# Patient Record
Sex: Female | Born: 1975 | Race: White | Hispanic: No | Marital: Married | Smoking: Former smoker
Health system: Southern US, Community
[De-identification: ages and names within clinical notes are randomized; demographics above are authoritative.]

## PROBLEM LIST (undated history)

## (undated) ENCOUNTER — Inpatient Hospital Stay (HOSPITAL_COMMUNITY): Payer: Self-pay

## (undated) DIAGNOSIS — J029 Acute pharyngitis, unspecified: Secondary | ICD-10-CM

## (undated) DIAGNOSIS — Z789 Other specified health status: Secondary | ICD-10-CM

## (undated) DIAGNOSIS — F988 Other specified behavioral and emotional disorders with onset usually occurring in childhood and adolescence: Secondary | ICD-10-CM

## (undated) DIAGNOSIS — N390 Urinary tract infection, site not specified: Secondary | ICD-10-CM

## (undated) DIAGNOSIS — Z8619 Personal history of other infectious and parasitic diseases: Secondary | ICD-10-CM

## (undated) DIAGNOSIS — H65199 Other acute nonsuppurative otitis media, unspecified ear: Secondary | ICD-10-CM

## (undated) DIAGNOSIS — O09529 Supervision of elderly multigravida, unspecified trimester: Secondary | ICD-10-CM

## (undated) DIAGNOSIS — B49 Unspecified mycosis: Secondary | ICD-10-CM

## (undated) DIAGNOSIS — E039 Hypothyroidism, unspecified: Secondary | ICD-10-CM

## (undated) DIAGNOSIS — J019 Acute sinusitis, unspecified: Secondary | ICD-10-CM

## (undated) HISTORY — PX: DILATION AND CURETTAGE OF UTERUS: SHX78

## (undated) HISTORY — DX: Acute pharyngitis, unspecified: J02.9

## (undated) HISTORY — DX: Unspecified mycosis: B49

## (undated) HISTORY — DX: Supervision of elderly multigravida, unspecified trimester: O09.529

## (undated) HISTORY — DX: Other specified behavioral and emotional disorders with onset usually occurring in childhood and adolescence: F98.8

## (undated) HISTORY — DX: Other acute nonsuppurative otitis media, unspecified ear: H65.199

## (undated) HISTORY — DX: Personal history of other infectious and parasitic diseases: Z86.19

## (undated) HISTORY — DX: Acute sinusitis, unspecified: J01.90

## (undated) HISTORY — DX: Hypothyroidism, unspecified: E03.9

## (undated) HISTORY — DX: Urinary tract infection, site not specified: N39.0

## (undated) HISTORY — PX: KNEE ARTHROSCOPY: SUR90

---

## 1998-04-16 ENCOUNTER — Emergency Department (HOSPITAL_COMMUNITY): Admission: EM | Admit: 1998-04-16 | Discharge: 1998-04-16 | Payer: Self-pay

## 2002-05-16 ENCOUNTER — Emergency Department (HOSPITAL_COMMUNITY): Admission: EM | Admit: 2002-05-16 | Discharge: 2002-05-17 | Payer: Self-pay | Admitting: Emergency Medicine

## 2002-05-17 ENCOUNTER — Encounter: Payer: Self-pay | Admitting: Emergency Medicine

## 2002-09-13 ENCOUNTER — Emergency Department (HOSPITAL_COMMUNITY): Admission: EM | Admit: 2002-09-13 | Discharge: 2002-09-13 | Payer: Self-pay | Admitting: Emergency Medicine

## 2002-09-14 ENCOUNTER — Encounter: Payer: Self-pay | Admitting: Surgery

## 2003-06-25 ENCOUNTER — Emergency Department (HOSPITAL_COMMUNITY): Admission: AD | Admit: 2003-06-25 | Discharge: 2003-06-25 | Payer: Self-pay | Admitting: Family Medicine

## 2005-08-06 ENCOUNTER — Other Ambulatory Visit: Admission: RE | Admit: 2005-08-06 | Discharge: 2005-08-06 | Payer: Self-pay | Admitting: Gynecology

## 2008-10-01 ENCOUNTER — Encounter: Payer: Self-pay | Admitting: Women's Health

## 2008-10-01 ENCOUNTER — Ambulatory Visit: Payer: Self-pay | Admitting: Women's Health

## 2008-10-01 ENCOUNTER — Other Ambulatory Visit: Admission: RE | Admit: 2008-10-01 | Discharge: 2008-10-01 | Payer: Self-pay | Admitting: Gynecology

## 2008-12-16 ENCOUNTER — Emergency Department: Payer: Self-pay | Admitting: Emergency Medicine

## 2009-01-28 ENCOUNTER — Ambulatory Visit: Payer: Self-pay | Admitting: Women's Health

## 2010-02-24 ENCOUNTER — Inpatient Hospital Stay (HOSPITAL_COMMUNITY): Admission: AD | Admit: 2010-02-24 | Discharge: 2010-02-24 | Payer: Self-pay | Admitting: Obstetrics and Gynecology

## 2010-02-24 ENCOUNTER — Ambulatory Visit: Payer: Self-pay | Admitting: Nurse Practitioner

## 2010-02-27 ENCOUNTER — Ambulatory Visit: Payer: Self-pay | Admitting: Women's Health

## 2010-03-09 ENCOUNTER — Ambulatory Visit: Payer: Self-pay | Admitting: Women's Health

## 2010-03-10 ENCOUNTER — Ambulatory Visit: Payer: Self-pay | Admitting: Gynecology

## 2010-03-10 ENCOUNTER — Ambulatory Visit (HOSPITAL_BASED_OUTPATIENT_CLINIC_OR_DEPARTMENT_OTHER): Admission: RE | Admit: 2010-03-10 | Discharge: 2010-03-10 | Payer: Self-pay | Admitting: Gynecology

## 2010-03-22 ENCOUNTER — Ambulatory Visit: Payer: Self-pay | Admitting: Gynecology

## 2010-09-24 ENCOUNTER — Inpatient Hospital Stay (HOSPITAL_COMMUNITY)
Admission: AD | Admit: 2010-09-24 | Discharge: 2010-09-24 | Payer: Self-pay | Source: Home / Self Care | Attending: Obstetrics and Gynecology | Admitting: Obstetrics and Gynecology

## 2010-09-25 LAB — URINALYSIS, ROUTINE W REFLEX MICROSCOPIC
Bilirubin Urine: NEGATIVE
Hgb urine dipstick: NEGATIVE
Ketones, ur: NEGATIVE mg/dL
Nitrite: NEGATIVE
Protein, ur: NEGATIVE mg/dL
Specific Gravity, Urine: 1.01 (ref 1.005–1.030)
Urine Glucose, Fasting: NEGATIVE mg/dL
Urobilinogen, UA: 0.2 mg/dL (ref 0.0–1.0)
pH: 6.5 (ref 5.0–8.0)

## 2010-11-13 ENCOUNTER — Inpatient Hospital Stay (HOSPITAL_COMMUNITY)
Admission: AD | Admit: 2010-11-13 | Discharge: 2010-11-13 | Disposition: A | Discharge status: Home / Self Care | Payer: BC Managed Care – PPO | Source: Ambulatory Visit | Attending: Obstetrics & Gynecology | Admitting: Obstetrics & Gynecology

## 2010-11-13 DIAGNOSIS — O4700 False labor before 37 completed weeks of gestation, unspecified trimester: Secondary | ICD-10-CM | POA: Insufficient documentation

## 2010-11-13 DIAGNOSIS — Y92009 Unspecified place in unspecified non-institutional (private) residence as the place of occurrence of the external cause: Secondary | ICD-10-CM | POA: Insufficient documentation

## 2010-11-13 DIAGNOSIS — W108XXA Fall (on) (from) other stairs and steps, initial encounter: Secondary | ICD-10-CM | POA: Insufficient documentation

## 2010-11-18 ENCOUNTER — Inpatient Hospital Stay (HOSPITAL_COMMUNITY)
Admission: AD | Admit: 2010-11-18 | Discharge: 2010-11-18 | Disposition: A | Discharge status: Home / Self Care | Payer: BC Managed Care – PPO | Source: Ambulatory Visit | Attending: Obstetrics & Gynecology | Admitting: Obstetrics & Gynecology

## 2010-11-18 DIAGNOSIS — O99891 Other specified diseases and conditions complicating pregnancy: Secondary | ICD-10-CM | POA: Insufficient documentation

## 2010-11-18 DIAGNOSIS — R109 Unspecified abdominal pain: Secondary | ICD-10-CM | POA: Insufficient documentation

## 2010-11-26 LAB — DIFFERENTIAL
Basophils Absolute: 0.1 10*3/uL (ref 0.0–0.1)
Basophils Relative: 0 % (ref 0–1)
Eosinophils Absolute: 0.3 10*3/uL (ref 0.0–0.7)
Eosinophils Relative: 1 % (ref 0–5)
Lymphocytes Relative: 9 % — ABNORMAL LOW (ref 12–46)
Lymphs Abs: 1.6 10*3/uL (ref 0.7–4.0)
Monocytes Absolute: 1 10*3/uL (ref 0.1–1.0)
Monocytes Relative: 6 % (ref 3–12)
Neutro Abs: 15.3 10*3/uL — ABNORMAL HIGH (ref 1.7–7.7)
Neutrophils Relative %: 84 % — ABNORMAL HIGH (ref 43–77)

## 2010-11-26 LAB — URINALYSIS, ROUTINE W REFLEX MICROSCOPIC
Bilirubin Urine: NEGATIVE
Glucose, UA: 100 mg/dL — AB
Ketones, ur: NEGATIVE mg/dL
Nitrite: POSITIVE — AB
Protein, ur: 100 mg/dL — AB
Specific Gravity, Urine: >1.03 — ABNORMAL HIGH (ref 1.005–1.030)
Urobilinogen, UA: 0.2 mg/dL (ref 0.0–1.0)
pH: 6 (ref 5.0–8.0)

## 2010-11-26 LAB — CBC
HCT: 36.9 % (ref 36.0–46.0)
Hemoglobin: 12.6 g/dL (ref 12.0–15.0)
MCHC: 34.3 g/dL (ref 30.0–36.0)
MCV: 95.1 fL (ref 78.0–100.0)
Platelets: 216 10*3/uL (ref 150–400)
RBC: 3.88 MIL/uL (ref 3.87–5.11)
RDW: 12.9 % (ref 11.5–15.5)
WBC: 18.3 10*3/uL — ABNORMAL HIGH (ref 4.0–10.5)

## 2010-11-26 LAB — URINE CULTURE: Colony Count: >=100000

## 2010-11-26 LAB — POCT PREGNANCY, URINE: Preg Test, Ur: POSITIVE

## 2010-11-26 LAB — HCG, QUANTITATIVE, PREGNANCY: hCG, Beta Chain, Quant, S: 86100 m[IU]/mL — ABNORMAL HIGH (ref <5)

## 2010-11-26 LAB — WET PREP, GENITAL
Clue Cells Wet Prep HPF POC: NONE SEEN
Trich, Wet Prep: NONE SEEN
Yeast Wet Prep HPF POC: NONE SEEN

## 2010-11-26 LAB — GC/CHLAMYDIA PROBE AMP, GENITAL
Chlamydia, DNA Probe: NEGATIVE
GC Probe Amp, Genital: NEGATIVE

## 2010-11-26 LAB — URINE MICROSCOPIC-ADD ON

## 2011-01-16 ENCOUNTER — Inpatient Hospital Stay (HOSPITAL_COMMUNITY)
Admission: AD | Admit: 2011-01-16 | Discharge: 2011-01-16 | Disposition: A | Discharge status: Home / Self Care | Payer: BC Managed Care – PPO | Source: Ambulatory Visit | Attending: Obstetrics and Gynecology | Admitting: Obstetrics and Gynecology

## 2011-01-16 DIAGNOSIS — O368190 Decreased fetal movements, unspecified trimester, not applicable or unspecified: Secondary | ICD-10-CM | POA: Insufficient documentation

## 2011-01-19 NOTE — Consult Note (Signed)
  NAME:  Cynthia Baxter              ACCOUNT NO.:  [id number]  MEDICAL RECORD NO.:  [id number]           PATIENT TYPE:  O  LOCATION:  [facility]                         [facility]  PHYSICIAN:  [name], M.D.DATE OF BIRTH:  05-07-1976  DATE OF CONSULTATION:  01/16/2011 DATE OF DISCHARGE:  01/16/2011                                CONSULTATION   CHIEF COMPLAINT:  Decreased fetal movement.  HISTORY OF PRESENT ILLNESS:  This is a 35 year old white female G2, P0 at 71 weeks' gestation who presents with decreased fetal movement today. She denies bleeding or leakage of fluid.  She denies regular contractions.  ALLERGIES:  She is allergic to SULFA.  MEDICATIONS:  Prenatal vitamins.  PAST MEDICAL HISTORY:  Otherwise, noncontributory.  SOCIAL HISTORY:  Noncontributory.  FAMILY HISTORY:  Anxiety, MI, COPD, stroke, heart disease, hypertension. She has a history of previous missed AB with D and C.  PHYSICAL EXAMINATION:  GENERAL:  Well-developed, well-nourished white female, in no acute distress. HEENT:  Normal. LUNGS:  Clear. HEART:  Regular rate and rhythm. ABDOMEN:  Soft, gravid, nontender.  Cervical __________. EXTREMITIES:  There are no cords. NEUROLOGIC:  Nonfocal. SKIN:  Intact.  NST is reactive.  The fetal heart rate tracing is in the 140-150 beats per minute range.  There are multiple 15 x 15 beat accelerations  noted. Rare contractions.  Category I tracing noted.  IMPRESSION:  Decreased fetal movement with reactive NST.  PLAN:  Discharge home.  Labor warnings given.  Reassurance given.     [name], [name]     RJT/MEDQ  D:  01/16/2011  T:  01/17/2011  Job:  [id number]  Electronically Signed by Olivia Mackie [name] on 01/19/2011 11:03:47 AM

## 2011-01-23 ENCOUNTER — Inpatient Hospital Stay (HOSPITAL_COMMUNITY)
Admission: AD | Admit: 2011-01-23 | Discharge: 2011-01-24 | Disposition: A | Discharge status: Home / Self Care | Payer: BC Managed Care – PPO | Source: Ambulatory Visit | Attending: Obstetrics and Gynecology | Admitting: Obstetrics and Gynecology

## 2011-01-23 DIAGNOSIS — O479 False labor, unspecified: Secondary | ICD-10-CM | POA: Insufficient documentation

## 2011-01-28 ENCOUNTER — Inpatient Hospital Stay (HOSPITAL_COMMUNITY)
Admission: AD | Admit: 2011-01-28 | Discharge: 2011-02-01 | DRG: 371 | Disposition: A | Discharge status: Home / Self Care | Payer: BC Managed Care – PPO | Source: Ambulatory Visit | Attending: Obstetrics and Gynecology | Admitting: Obstetrics and Gynecology

## 2011-01-28 DIAGNOSIS — O09529 Supervision of elderly multigravida, unspecified trimester: Secondary | ICD-10-CM | POA: Diagnosis present

## 2011-01-28 DIAGNOSIS — O321XX0 Maternal care for breech presentation, not applicable or unspecified: Principal | ICD-10-CM | POA: Diagnosis present

## 2011-01-28 LAB — CBC
HCT: 42.4 % (ref 36.0–46.0)
Hemoglobin: 14.5 g/dL (ref 12.0–15.0)
MCH: 31.7 pg (ref 26.0–34.0)
MCHC: 34.2 g/dL (ref 30.0–36.0)
MCV: 92.6 fL (ref 78.0–100.0)
Platelets: 196 10*3/uL (ref 150–400)
RBC: 4.58 MIL/uL (ref 3.87–5.11)
RDW: 13.1 % (ref 11.5–15.5)
WBC: 14.6 10*3/uL — ABNORMAL HIGH (ref 4.0–10.5)

## 2011-01-29 LAB — RPR: RPR Ser Ql: NONREACTIVE

## 2011-01-30 LAB — CBC
HCT: 30.7 % — ABNORMAL LOW (ref 36.0–46.0)
Hemoglobin: 10.1 g/dL — ABNORMAL LOW (ref 12.0–15.0)
MCH: 31.2 pg (ref 26.0–34.0)
MCHC: 32.9 g/dL (ref 30.0–36.0)
MCV: 94.8 fL (ref 78.0–100.0)
Platelets: 136 10*3/uL — ABNORMAL LOW (ref 150–400)
RBC: 3.24 MIL/uL — ABNORMAL LOW (ref 3.87–5.11)
RDW: 13.5 % (ref 11.5–15.5)
WBC: 10.6 10*3/uL — ABNORMAL HIGH (ref 4.0–10.5)

## 2011-02-09 NOTE — H&P (Signed)
  NAMEDortha Baxter              ACCOUNT NO.:  [id number]  MEDICAL RECORD NO.:  [id number]           PATIENT TYPE:  I  LOCATION:  9127                          [facility]:  [facility]  PHYSICIAN:  [name], M.D.DATE OF BIRTH:  08-02-1976  DATE OF ADMISSION:  01/28/2011 DATE OF DISCHARGE:                             HISTORY & PHYSICAL   PREOPERATIVE DIAGNOSIS:  Breech at 39 weeks and active labor.  POSTOPERATIVE DIAGNOSIS:  Breech at 39 weeks and active labor.  PROCEDURE:  Primary low segment transverse cesarean section.  SURGEON:  [name], MD  ASSISTANT:  None.  ANESTHESIA:  Epidural.  ESTIMATED BLOOD LOSS:  1000 mL.  COMPLICATIONS:  None.  DRAINS:  Foley.  COUNTS:  Correct.  The patient went to recovery in good condition.  FINDINGS:  Full-term living female, frank breech.  Apgars 9 and 9. Placenta anteriorly located.  Normal tubes, normal ovaries.  Three small subserosal fibroids on the posterior wall of the uterus.  Two-layer uterine closure.  BRIEF OPERATIVE NOTE:  After being apprised of the risks of anesthesia, infection, bleeding, injury to abdominal organs, need for repair, delayed versus immediate complications to include bowel and bladder injury, possible need for repair, the patient was brought to the operating room where she was administered dosing of epidural anesthetic without complications, prepped and draped in usual sterile fashion. Foley catheter placed.  After achieving adequate anesthesia, dilute Marcaine solution was placed.  A Pfannenstiel skin incision was made with a scalpel, carried down to the fascia which was nicked in midline and opened transversely using Mayo scissors.  Rectus muscle was dissected sharply in the midline.  Peritoneum was entered sharply, a bladder blade was placed.  The visceral peritoneum was scored sharply off the lower uterine segment.  Kerr hysterotomy incision was made. Atraumatic delivery from a frank  breech position using usual maneuvers with flexion of the fetal vertex prior to delivery performed without complication.  Placenta was delivered manually intact.  Cord was clamped, baby was handed off to the pediatricians who were in attendance.  Apgars as noted.  Cord blood collected as noted.  Uterus exteriorized, curette using a dry lap pack and inspected.  No evidence of uterine anomalies noted.  Uterus was closed in 2 running imbricating layers of 0 Monocryl suture.  Good hemostasis was noted.  Irrigation was accomplished. Bladder flap was inspected and found to be hemostatic.  Urine output was excellent, urine was clear.  At this time, fascia was reapproximated using a 0 Monocryl is continuous running fashion and skin closed using staples.  The patient tolerated the procedure well and was transferred to recovery in good condition.     [name], [name]     RJT/MEDQ  D:  01/29/2011  T:  01/29/2011  Job:  [id number]  Electronically Signed by Olivia Mackie [name] on 02/09/2011 02:02:03 PM

## 2011-02-09 NOTE — H&P (Signed)
  NAMEDortha Baxter              ACCOUNT NO.:  [id number]  MEDICAL RECORD NO.:  [id number]           PATIENT TYPE:  I  LOCATION:  9161                          [facility]ILITY[facility]  [facility]  PHYSICIAN:  [name], M.D.DATE OF BIRTH:  11/15/1975  DATE OF ADMISSION:  01/28/2011 DATE OF DISCHARGE:                             HISTORY & PHYSICAL   CHIEF COMPLAINT:  Contractions.  HISTORY OF PRESENT ILLNESS:  She is a 35 year old female G2, P0 at 42- 4/7 weeks' gestation who presents with increased frequency of contractions today.  She has allergies to SULFA.  She is a nonsmoker, nondrinker.  She denies domestic or physical violence.  Medications include prenatal vitamins. She has a family history of anxiety, heart disease, hypertension, stroke, and COPD.  She has a history of missed AB, D&C, and arthroscopic surgery.  She also has a history of intrinsic anxiety.  Currently on no medication.  Prenatal course to date has been uncomplicated.  PHYSICAL EXAMINATION:  GENERAL:  She is a well-developed, well-nourished white female in moderate amount of distress with her contractions. HEENT:  Normal. NECK:  Supple.  Full range of motion. LUNGS:  Clear. HEART:  Regular rate and rhythm. ABDOMEN:  Soft, gravid, nontender.  Estimated fetal weight __________ pounds.  Cervix is 2-3, 90% vertex, -1. EXTREMITIES:  No cords. NEUROLOGIC:  Nonfocal. SKIN:  Intact.  CLINICAL IMPRESSION:  Term intrauterine pregnancy and early labor.  PLAN:  Epidural as needed.     [name], [name]     RJT/MEDQ  D:  01/28/2011  T:  01/29/2011  Job:  [id number]  Electronically Signed by Olivia Mackie [name] on 02/09/2011 02:02:01 PM

## 2011-02-09 NOTE — Discharge Summary (Signed)
  NAMEDortha Baxter              ACCOUNT NO.:  [id number]  MEDICAL RECORD NO.:  [id number]           PATIENT TYPE:  I  LOCATION:  9127                          [facility]  PHYSICIAN:  [name], M.D.DATE OF BIRTH:  August 19, 1976  DATE OF ADMISSION:  01/28/2011 DATE OF DISCHARGE:  02/01/2011                              DISCHARGE SUMMARY   ADMISSION DIAGNOSES:  Term pregnancy, breech presentation in labor.  DISCHARGE DIAGNOSIS:  Postoperative day #3, stable.  HISTORY:  The patient is a gravida 2, para 0-0-1-0 at 31 weeks and 3 days' gestation with an [facility] of Feb 01, 2011.  Prenatal care was obtained at [facility] since 8 weeks and 3 days' gestation with Dr. Juliene Pina as primary.  PRENATAL LABORATORY DATA:  O positive, rubella immune, GBS negative, HIV negative, RPR nonreactive, hepatitis B nonreactive, and a normal 3-hour GTT.  Prenatal course complicated by breech presentation and advanced maternal age.  MEDICAL SURGICAL HISTORY:  Knee surgery and D and C.  ALLERGIES:  SULFA.  CURRENT MEDICATIONS:  Prenatal vitamins.  PRESENTATION:  The patient presented to the hospital in labor and was found to be breech presentation.  ADMISSION LABORATORY DATA:  CBC:  WBC 14.6, hemoglobin 14.5, hematocrit 42.4, and platelets 196.  RPR negative.  The patient was prepped for cesarean section.    SURGERY: The patient was delivered via cesarean section on Jan 29, 2011,  by Dr. Billy Coast for breech presentation in labor.  The patient delivered  a female infant with a weight of 6 pounds and 7 ounces and Apgars of 9  and 9.  Newborn was transferred to Regular Nursery. Please see operative  record for further detalis.  POSTOPERATIVE COURSE:  Postoperative course was uneventful.  POSTOPERATIVE LABORATORY DATA:  WBC 10.6, hemoglobin 10.1, hematocrit 30.7, and platelets 136.  Vital signs remained stable throughout hospital stay and the patient was afebrile.  Physical exam was within normal  limits.  Wound was approximated with staples.  No erythema and no ecchymosis at site.  Newborn was breastfed.  DISCHARGE:  The patient was discharged home on postoperative day #3 in stable condition.  Staples were removed and replaced with Steri-Strips prior to discharge.  DIET:  Regular.  ACTIVITY:  Ad lib with postoperative weightlifting restrictions x2 weeks.  INSTRUCTIONS:  Per [facility] booklet.  DISCHARGE MEDICATIONS: 1. Prenatal vitamins 1 tablet p.o. daily. 2. Ibuprofen 800 mg p.o. q.8 h. 3. Tylox 1-2 tablets p.o. q.4-6 h. p.r.n. for pain.  FOLLOWUP:  In 6 weeks postpartum for postpartum visit.    ______________________________ Arlan Organ, CNM   ______________________________ [name], [name]    DP/MEDQ  D:  02/02/2011  T:  02/03/2011  Job:  [id number]  Electronically Signed by Arlan Organ CNM on 02/05/2011 12:20:47 AM Electronically Signed by Olivia Mackie [name] on 02/09/2011 02:02:05 PM

## 2011-08-24 ENCOUNTER — Other Ambulatory Visit: Payer: Self-pay | Admitting: Obstetrics & Gynecology

## 2011-08-24 LAB — OB RESULTS CONSOLE RPR: RPR: NONREACTIVE

## 2011-08-24 LAB — OB RESULTS CONSOLE HIV ANTIBODY (ROUTINE TESTING): HIV: NONREACTIVE

## 2011-08-24 LAB — OB RESULTS CONSOLE ABO/RH: RH Type: POSITIVE

## 2011-08-24 LAB — OB RESULTS CONSOLE GC/CHLAMYDIA: Gonorrhea: NEGATIVE

## 2011-09-11 NOTE — L&D Delivery Note (Addendum)
Delivery Note At 5:41 PM a viable and healthy female was delivered via VBAC, Vacuum Assisted (Presentation: ; Right Occiput Anterior).  APGAR:9,9; weight 5 lbs 15 oz .   Placenta status: Spontaneous and intact.  Uterus explored and found without defects.  Cervix was without lacerations.  Cord: 3 vessels.  Anesthesia: Epidural  Episiotomy: None Lacerations: 2nd degree Suture Repair: 3.0 chromic Est. Blood Loss (mL): 400  Mom to postpartum.  Baby to nursery-stable.  Mickel Baas 03/19/2012, 6:22 PM

## 2012-03-03 LAB — OB RESULTS CONSOLE GBS: GBS: NEGATIVE

## 2012-03-06 ENCOUNTER — Inpatient Hospital Stay (HOSPITAL_COMMUNITY)
Admission: AD | Admit: 2012-03-06 | Discharge: 2012-03-06 | Disposition: A | Discharge status: Home / Self Care | Payer: BC Managed Care – PPO | Source: Ambulatory Visit | Attending: Obstetrics and Gynecology | Admitting: Obstetrics and Gynecology

## 2012-03-06 ENCOUNTER — Encounter (HOSPITAL_COMMUNITY): Payer: Self-pay | Admitting: *Deleted

## 2012-03-06 DIAGNOSIS — O4700 False labor before 37 completed weeks of gestation, unspecified trimester: Secondary | ICD-10-CM | POA: Insufficient documentation

## 2012-03-06 DIAGNOSIS — O99891 Other specified diseases and conditions complicating pregnancy: Secondary | ICD-10-CM | POA: Insufficient documentation

## 2012-03-06 DIAGNOSIS — O212 Late vomiting of pregnancy: Secondary | ICD-10-CM | POA: Insufficient documentation

## 2012-03-06 DIAGNOSIS — K5289 Other specified noninfective gastroenteritis and colitis: Secondary | ICD-10-CM | POA: Insufficient documentation

## 2012-03-06 DIAGNOSIS — K529 Noninfective gastroenteritis and colitis, unspecified: Secondary | ICD-10-CM

## 2012-03-06 HISTORY — DX: Other specified health status: Z78.9

## 2012-03-06 LAB — URINALYSIS, ROUTINE W REFLEX MICROSCOPIC
Bilirubin Urine: NEGATIVE
Glucose, UA: NEGATIVE mg/dL
Ketones, ur: NEGATIVE mg/dL
Leukocytes, UA: NEGATIVE
Nitrite: NEGATIVE
Protein, ur: NEGATIVE mg/dL
Specific Gravity, Urine: 1.02 (ref 1.005–1.030)
Urobilinogen, UA: 0.2 mg/dL (ref 0.0–1.0)
pH: 6 (ref 5.0–8.0)

## 2012-03-06 LAB — URINE MICROSCOPIC-ADD ON

## 2012-03-06 MED ORDER — ONDANSETRON 8 MG PO TBDP: 8.0000 mg | ORAL_TABLET | Freq: Three times a day (TID) | ORAL | Status: AC | PRN

## 2012-03-06 MED ORDER — ONDANSETRON 8 MG PO TBDP
8.0000 mg | ORAL_TABLET | Freq: Once | ORAL | Status: AC
  Administered 2012-03-06: 8 mg via ORAL
  Filled 2012-03-06: qty 1

## 2012-03-06 MED ORDER — LOPERAMIDE HCL 2 MG PO TABS: 2.0000 mg | ORAL_TABLET | ORAL | Status: DC

## 2012-03-06 MED ORDER — LOPERAMIDE HCL 2 MG PO CAPS
4.0000 mg | ORAL_CAPSULE | Freq: Once | ORAL | Status: AC
  Administered 2012-03-06: 4 mg via ORAL
  Filled 2012-03-06: qty 2

## 2012-03-06 NOTE — [facility]U Note (Signed)
Pt G2 P1 at 36.2wks with contractions, nausea and diarrhea.

## 2012-03-06 NOTE — Discharge Instructions (Signed)
Diet for Diarrhea, Adult Having frequent, runny stools (diarrhea) has many causes. Diarrhea may be caused or worsened by food or drink. Diarrhea may be relieved by changing your diet. IF YOU ARE NOT TOLERATING SOLID FOODS:  Drink enough water and fluids to keep your urine clear or pale yellow.   Avoid sugary drinks and sodas as well as milk-based beverages.   Avoid beverages containing caffeine and alcohol.   You may try rehydrating beverages. You can make your own by following this recipe:    tsp table salt.    tsp baking soda.   ? tsp salt substitute (potassium chloride).   1 tbs + 1 tsp sugar.   1 qt water.  As your stools become more solid, you can start eating solid foods. Add foods one at a time. If a certain food causes your diarrhea to get worse, avoid that food and try other foods. A low fiber, low-fat, and lactose-free diet is recommended. Small, frequent meals may be better tolerated.  Starches  Allowed:  White, French, and pita breads, plain rolls, buns, bagels. Plain muffins, matzo. Soda, saltine, or graham crackers. Pretzels, melba toast, zwieback. Cooked cereals made with water: cornmeal, farina, cream cereals. Dry cereals: refined corn, wheat, rice. Potatoes prepared any way without skins, refined macaroni, spaghetti, noodles, refined rice.   Avoid:  Bread, rolls, or crackers made with whole wheat, multi-grains, rye, bran seeds, nuts, or coconut. Corn tortillas or taco shells. Cereals containing whole grains, multi-grains, bran, coconut, nuts, or raisins. Cooked or dry oatmeal. Coarse wheat cereals, granola. Cereals advertised as "high-fiber." Potato skins. Whole grain pasta, wild or brown rice. Popcorn. Sweet potatoes/yams. Sweet rolls, doughnuts, waffles, pancakes, sweet breads.  Vegetables  Allowed: Strained tomato and vegetable juices. Most well-cooked and canned vegetables without seeds. Fresh: Tender lettuce, cucumber without the skin, cabbage, spinach, bean  sprouts.   Avoid: Fresh, cooked, or canned: Artichokes, baked beans, beet greens, broccoli, Brussels sprouts, corn, kale, legumes, peas, sweet potatoes. Cooked: Green or red cabbage, spinach. Avoid large servings of any vegetables, because vegetables shrink when cooked, and they contain more fiber per serving than fresh vegetables.  Fruit  Allowed: All fruit juices except prune juice. Cooked or canned: Apricots, applesauce, cantaloupe, cherries, fruit cocktail, grapefruit, grapes, kiwi, mandarin oranges, peaches, pears, plums, watermelon. Fresh: Apples without skin, ripe banana, grapes, cantaloupe, cherries, grapefruit, peaches, oranges, plums. Keep servings limited to  cup or 1 piece.   Avoid: Fresh: Apple with skin, apricots, mango, pears, raspberries, strawberries. Prune juice, stewed or dried prunes. Dried fruits, raisins, dates. Large servings of all fresh fruits.  Meat and Meat Substitutes  Allowed: Ground or well-cooked tender beef, ham, veal, lamb, pork, or poultry. Eggs, plain cheese. Fish, oysters, shrimp, lobster, other seafoods. Liver, organ meats.   Avoid: Tough, fibrous meats with gristle. Peanut butter, smooth or chunky. Cheese, nuts, seeds, legumes, dried peas, beans, lentils.  Milk  Allowed: Yogurt, lactose-free milk, kefir, drinkable yogurt, buttermilk, soy milk.   Avoid: Milk, chocolate milk, beverages made with milk, such as milk shakes.  Soups  Allowed: Bouillon, broth, or soups made from allowed foods. Any strained soup.   Avoid: Soups made from vegetables that are not allowed, cream or milk-based soups.  Desserts and Sweets  Allowed: Sugar-free gelatin, sugar-free frozen ice pops made without sugar alcohol.   Avoid: Plain cakes and cookies, pie made with allowed fruit, pudding, custard, cream pie. Gelatin, fruit, ice, sherbet, frozen ice pops. Ice cream, ice milk without nuts. Plain hard candy,   honey, jelly, molasses, syrup, sugar, chocolate syrup, gumdrops,  marshmallows.  Fats and Oils  Allowed: Avoid any fats and oils.   Avoid: Seeds, nuts, olives, avocados. Margarine, butter, cream, mayonnaise, salad oils, plain salad dressings made from allowed foods. Plain gravy, crisp bacon without rind.  Beverages  Allowed: Water, decaffeinated teas, oral rehydration solutions, sugar-free beverages.   Avoid: Fruit juices, caffeinated beverages (coffee, tea, soda or pop), alcohol, sports drinks, or lemon-lime soda or pop.  Condiments  Allowed: Ketchup, mustard, horseradish, vinegar, cream sauce, cheese sauce, cocoa powder. Spices in moderation: allspice, basil, bay leaves, celery powder or leaves, cinnamon, cumin powder, curry powder, ginger, mace, marjoram, onion or garlic powder, oregano, paprika, parsley flakes, ground pepper, rosemary, sage, savory, tarragon, thyme, turmeric.   Avoid: Coconut, honey.  Weight Monitoring: Weigh yourself every day. You should weigh yourself in the morning after you urinate and before you eat breakfast. Wear the same amount of clothing when you weigh yourself. Record your weight daily. Bring your recorded weights to your clinic visits. Tell your caregiver right away if you have gained 3 lb/1.4 kg or more in 1 day, 5 lb/2.3 kg in a week, or whatever amount you were told to report. SEEK IMMEDIATE MEDICAL CARE IF:   You are unable to keep fluids down.   You start to throw up (vomit) or diarrhea keeps coming back (persistent).   Abdominal pain develops, increases, or can be felt in one place (localizes).   You have an oral temperature above 102 F (38.9 C), not controlled by medicine.   Diarrhea contains blood or mucus.   You develop excessive weakness, dizziness, fainting, or extreme thirst.  MAKE SURE YOU:   Understand these instructions.   Will watch your condition.   Will get help right away if you are not doing well or get worse.  Document Released: 11/17/2003 Document Revised: 08/16/2011 Document Reviewed:  03/10/2009 [facility] Patient Information 2012 [place], [place].Diet for Diarrhea, Adult Having frequent, runny stools (diarrhea) has many causes. Diarrhea may be caused or worsened by food or drink. Diarrhea may be relieved by changing your diet. IF YOU ARE NOT TOLERATING SOLID FOODS:  Drink enough water and fluids to keep your urine clear or pale yellow.   Avoid sugary drinks and sodas as well as milk-based beverages.   Avoid beverages containing caffeine and alcohol.   You may try rehydrating beverages. You can make your own by following this recipe:    tsp table salt.    tsp baking soda.   ? tsp salt substitute (potassium chloride).   1 tbs + 1 tsp sugar.   1 qt water.  As your stools become more solid, you can start eating solid foods. Add foods one at a time. If a certain food causes your diarrhea to get worse, avoid that food and try other foods. A low fiber, low-fat, and lactose-free diet is recommended. Small, frequent meals may be better tolerated.  Starches  Allowed:  White, [place], and pita breads, plain rolls, buns, bagels. Plain muffins, matzo. Soda, saltine, or graham crackers. Pretzels, melba toast, zwieback. Cooked cereals made with water: cornmeal, farina, cream cereals. Dry cereals: refined corn, wheat, rice. Potatoes prepared any way without skins, refined macaroni, spaghetti, noodles, refined rice.   Avoid:  Bread, rolls, or crackers made with whole wheat, multi-grains, rye, bran seeds, nuts, or coconut. Corn tortillas or taco shells. Cereals containing whole grains, multi-grains, bran, coconut, nuts, or raisins. Cooked or dry oatmeal. Coarse wheat cereals, granola. Cereals advertised  as "high-fiber." Potato skins. Whole grain pasta, wild or brown rice. Popcorn. Sweet potatoes/yams. Sweet rolls, doughnuts, waffles, pancakes, sweet breads.  Vegetables  Allowed: Strained tomato and vegetable juices. Most well-cooked and canned vegetables without seeds. Fresh: Tender  lettuce, cucumber without the skin, cabbage, spinach, bean sprouts.   Avoid: Fresh, cooked, or canned: Artichokes, baked beans, beet greens, broccoli, Brussels sprouts, corn, kale, legumes, peas, sweet potatoes. Cooked: Green or red cabbage, spinach. Avoid large servings of any vegetables, because vegetables shrink when cooked, and they contain more fiber per serving than fresh vegetables.  Fruit  Allowed: All fruit juices except prune juice. Cooked or canned: Apricots, applesauce, cantaloupe, cherries, fruit cocktail, grapefruit, grapes, kiwi, mandarin oranges, peaches, pears, plums, watermelon. Fresh: Apples without skin, ripe banana, grapes, cantaloupe, cherries, grapefruit, peaches, oranges, plums. Keep servings limited to  cup or 1 piece.   Avoid: Fresh: Apple with skin, apricots, mango, pears, raspberries, strawberries. Prune juice, stewed or dried prunes. Dried fruits, raisins, dates. Large servings of all fresh fruits.  Meat and Meat Substitutes  Allowed: Ground or well-cooked tender beef, ham, veal, lamb, pork, or poultry. Eggs, plain cheese. Fish, oysters, shrimp, lobster, other seafoods. Liver, organ meats.   Avoid: Tough, fibrous meats with gristle. Peanut butter, smooth or chunky. Cheese, nuts, seeds, legumes, dried peas, beans, lentils.  Milk  Allowed: Yogurt, lactose-free milk, kefir, drinkable yogurt, buttermilk, soy milk.   Avoid: Milk, chocolate milk, beverages made with milk, such as milk shakes.  Soups  Allowed: Bouillon, broth, or soups made from allowed foods. Any strained soup.   Avoid: Soups made from vegetables that are not allowed, cream or milk-based soups.  Desserts and Sweets  Allowed: Sugar-free gelatin, sugar-free frozen ice pops made without sugar alcohol.   Avoid: Plain cakes and cookies, pie made with allowed fruit, pudding, custard, cream pie. Gelatin, fruit, ice, sherbet, frozen ice pops. Ice cream, ice milk without nuts. Plain hard candy, honey, jelly,  molasses, syrup, sugar, chocolate syrup, gumdrops, marshmallows.  Fats and Oils  Allowed: Avoid any fats and oils.   Avoid: Seeds, nuts, olives, avocados. Margarine, butter, cream, mayonnaise, salad oils, plain salad dressings made from allowed foods. Plain gravy, crisp bacon without rind.  Beverages  Allowed: Water, decaffeinated teas, oral rehydration solutions, sugar-free beverages.   Avoid: Fruit juices, caffeinated beverages (coffee, tea, soda or pop), alcohol, sports drinks, or lemon-lime soda or pop.  Condiments  Allowed: Ketchup, mustard, horseradish, vinegar, cream sauce, cheese sauce, cocoa powder. Spices in moderation: allspice, basil, bay leaves, celery powder or leaves, cinnamon, cumin powder, curry powder, ginger, mace, marjoram, onion or garlic powder, oregano, paprika, parsley flakes, ground pepper, rosemary, sage, savory, tarragon, thyme, turmeric.   Avoid: Coconut, honey.  Weight Monitoring: Weigh yourself every day. You should weigh yourself in the morning after you urinate and before you eat breakfast. Wear the same amount of clothing when you weigh yourself. Record your weight daily. Bring your recorded weights to your clinic visits. Tell your caregiver right away if you have gained 3 lb/1.4 kg or more in 1 day, 5 lb/2.3 kg in a week, or whatever amount you were told to report. SEEK IMMEDIATE MEDICAL CARE IF:   You are unable to keep fluids down.   You start to throw up (vomit) or diarrhea keeps coming back (persistent).   Abdominal pain develops, increases, or can be felt in one place (localizes).   You have an oral temperature above 102 F (38.9 C), not controlled by medicine.   Diarrhea  contains blood or mucus.   You develop excessive weakness, dizziness, fainting, or extreme thirst.  MAKE SURE YOU:   Understand these instructions.   Will watch your condition.   Will get help right away if you are not doing well or get worse.  Document Released:  11/17/2003 Document Revised: 08/16/2011 Document Reviewed: 03/10/2009 [facility] [employer] Patient Information 2012 [place], [place].

## 2012-03-06 NOTE — MAU Provider Note (Signed)
Chief Complaint:  Contractions, Nausea and Diarrhea   None     HPI  Cynthia Baxter is a 36 y.o. G3P1011 at [redacted]w[redacted]d presenting with diarrhea x 1 day, vomiting since arrival to MAU and mild-moderate contractions. Denies leakage of fluid or vaginal bleeding. Good fetal movement. Reports sick contacts.   Past Medical History: Past Medical History  Diagnosis Date  . No pertinent past medical history     Past Surgical History: Past Surgical History  Procedure Date  . Cesarean section   . Knee arthroscopy     Family History: Family History  Problem Relation Age of Onset  . Other Neg Hx     Social History: History  Substance Use Topics  . Smoking status: Never Smoker   . Smokeless tobacco: Not on file  . Alcohol Use: No    Allergies:  Allergies  Allergen Reactions  . Sulfa Antibiotics Hives    Meds:  Prescriptions prior to admission  Medication Sig Dispense Refill  . ketoconazole (NIZORAL) 200 MG tablet Take 200 mg by mouth 2 (two) times daily.      . Prenatal Vit-Fe Fumarate-FA (MULTIVITAMIN-PRENATAL) 27-0.8 MG TABS Take 1 tablet by mouth daily.          Physical Exam  Blood pressure 112/71, pulse 103, temperature 97.7 F (36.5 C), temperature source Oral, resp. rate 18, height 5\' 2"  (1.575 m), weight 65.772 kg (145 lb). GENERAL: Well-developed, well-nourished female in mild distress.  HEENT: normocephalic, good dentition HEART: normal rate RESP: normal effort ABDOMEN: Soft, nontender, gravid appropriate for gestational age EXTREMITIES: Nontender, no edema NEURO: alert and oriented  SPECULUM EXAM: Deferred  Dilation: 1 Effacement (%): 70 Cervical Position: Middle Station: -2 Presentation: Undeterminable Exam by:: Peace, rn  FHT:  Baseline 130 , moderate variability, accelerations present, no decelerations Contractions: q 2-5 mins   Labs: Results for orders placed during the hospital encounter of 03/06/12 (from the past 24 hour(s))  URINALYSIS,  ROUTINE W REFLEX MICROSCOPIC     Status: Abnormal   Collection Time   03/06/12  1:31 AM      Component Value Range   Color, Urine YELLOW  YELLOW   APPearance CLEAR  CLEAR   Specific Gravity, Urine 1.020  1.005 - 1.030   pH 6.0  5.0 - 8.0   Glucose, UA NEGATIVE  NEGATIVE mg/dL   Hgb urine dipstick TRACE (*) NEGATIVE   Bilirubin Urine NEGATIVE  NEGATIVE   Ketones, ur NEGATIVE  NEGATIVE mg/dL   Protein, ur NEGATIVE  NEGATIVE mg/dL   Urobilinogen, UA 0.2  0.0 - 1.0 mg/dL   Nitrite NEGATIVE  NEGATIVE   Leukocytes, UA NEGATIVE  NEGATIVE  URINE MICROSCOPIC-ADD ON     Status: Abnormal   Collection Time   03/06/12  1:31 AM      Component Value Range   Squamous Epithelial / LPF FEW (*) RARE   WBC, UA 0-2  <3 WBC/hpf   RBC / HPF 0-2  <3 RBC/hpf   Bacteria, UA FEW (*) RARE   Imaging:  NA  N/V resolved w/ zofran. Imodium given. No further diarrhea. UC's decreased.   Assessment: 1. Gastroenteritis, acute    Plan: Discharge home per consult with Dr. Eula Fried diet Push fluids Preterm labor precautions and fetal kick counts Follow-up Information    Follow up with Almon Hercules., MD. (or MAU as needed if symptoms worsen)    Contact information:   [address] [place] [address] [phone]  Medication List  As of 03/07/2012  4:10 AM   START taking these medications         loperamide 2 MG tablet   Commonly known as: IMODIUM A-D   Take 1 tablet (2 mg total) by mouth as directed.      ondansetron 8 MG disintegrating tablet   Commonly known as: ZOFRAN-ODT   Take 1 tablet (8 mg total) by mouth every 8 (eight) hours as needed for nausea.         CONTINUE taking these medications         ketoconazole 200 MG tablet   Commonly known as: NIZORAL      multivitamin-prenatal 27-0.8 MG Tabs          Where to get your medications    These are the prescriptions that you need to pick up.   You may get these medications from any pharmacy.           loperamide 2 MG tablet   ondansetron 8 MG disintegrating tablet           [name] 03/06/2012 3:11 AM

## 2012-03-10 ENCOUNTER — Other Ambulatory Visit (HOSPITAL_COMMUNITY): Payer: Self-pay | Admitting: *Deleted

## 2012-03-11 ENCOUNTER — Telehealth (HOSPITAL_COMMUNITY): Payer: Self-pay | Admitting: *Deleted

## 2012-03-11 ENCOUNTER — Encounter (HOSPITAL_COMMUNITY): Payer: Self-pay | Admitting: *Deleted

## 2012-03-11 NOTE — Telephone Encounter (Signed)
Preadmission screen  

## 2012-03-14 ENCOUNTER — Inpatient Hospital Stay (HOSPITAL_COMMUNITY)
Admission: AD | Admit: 2012-03-14 | Discharge: 2012-03-14 | Disposition: A | Discharge status: Home / Self Care | Payer: BC Managed Care – PPO | Source: Ambulatory Visit | Attending: Obstetrics and Gynecology | Admitting: Obstetrics and Gynecology

## 2012-03-14 DIAGNOSIS — O365990 Maternal care for other known or suspected poor fetal growth, unspecified trimester, not applicable or unspecified: Secondary | ICD-10-CM | POA: Insufficient documentation

## 2012-03-19 ENCOUNTER — Inpatient Hospital Stay (HOSPITAL_COMMUNITY): Payer: BC Managed Care – PPO | Admitting: Anesthesiology

## 2012-03-19 ENCOUNTER — Encounter (HOSPITAL_COMMUNITY): Payer: Self-pay

## 2012-03-19 ENCOUNTER — Inpatient Hospital Stay (HOSPITAL_COMMUNITY)
Admission: RE | Admit: 2012-03-19 | Discharge: 2012-03-20 | DRG: 373 | Disposition: A | Discharge status: Home / Self Care | Payer: BC Managed Care – PPO | Source: Ambulatory Visit | Attending: Obstetrics & Gynecology | Admitting: Obstetrics & Gynecology

## 2012-03-19 ENCOUNTER — Encounter (HOSPITAL_COMMUNITY): Payer: Self-pay | Admitting: Anesthesiology

## 2012-03-19 VITALS — BP 94/60 | HR 76 | Temp 97.8°F | Resp 18 | Ht 62.0 in | Wt 149.0 lb

## 2012-03-19 DIAGNOSIS — O878 Other venous complications in the puerperium: Secondary | ICD-10-CM | POA: Diagnosis present

## 2012-03-19 DIAGNOSIS — O365990 Maternal care for other known or suspected poor fetal growth, unspecified trimester, not applicable or unspecified: Principal | ICD-10-CM | POA: Diagnosis present

## 2012-03-19 DIAGNOSIS — O34219 Maternal care for unspecified type scar from previous cesarean delivery: Secondary | ICD-10-CM | POA: Diagnosis present

## 2012-03-19 DIAGNOSIS — K649 Unspecified hemorrhoids: Secondary | ICD-10-CM | POA: Diagnosis present

## 2012-03-19 LAB — CBC
MCH: 30.3 pg (ref 26.0–34.0)
MCV: 90.8 fL (ref 78.0–100.0)
Platelets: 181 10*3/uL (ref 150–400)
RBC: 4.02 MIL/uL (ref 3.87–5.11)

## 2012-03-19 MED ORDER — FLEET ENEMA 7-19 GM/118ML RE ENEM: 1.0000 | ENEMA | RECTAL | Status: DC | PRN

## 2012-03-19 MED ORDER — OXYTOCIN 40 UNITS IN LACTATED RINGERS INFUSION - SIMPLE MED
1.0000 m[IU]/min | INTRAVENOUS | Status: DC
  Administered 2012-03-19: 2 m[IU]/min via INTRAVENOUS

## 2012-03-19 MED ORDER — WITCH HAZEL-GLYCERIN EX PADS: 1.0000 "application " | MEDICATED_PAD | CUTANEOUS | Status: DC | PRN

## 2012-03-19 MED ORDER — ACETAMINOPHEN 325 MG PO TABS: 650.0000 mg | ORAL_TABLET | ORAL | Status: DC | PRN

## 2012-03-19 MED ORDER — TETANUS-DIPHTH-ACELL PERTUSSIS 5-2.5-18.5 LF-MCG/0.5 IM SUSP
0.5000 mL | Freq: Once | INTRAMUSCULAR | Status: AC
  Administered 2012-03-20: 0.5 mL via INTRAMUSCULAR
  Filled 2012-03-19: qty 0.5

## 2012-03-19 MED ORDER — FENTANYL 2.5 MCG/ML BUPIVACAINE 1/10 % EPIDURAL INFUSION (WH - ANES)
14.0000 mL/h | INTRAMUSCULAR | Status: DC
  Administered 2012-03-19: 14 mL/h via EPIDURAL
  Filled 2012-03-19 (×2): qty 60

## 2012-03-19 MED ORDER — ONDANSETRON HCL 4 MG PO TABS: 4.0000 mg | ORAL_TABLET | ORAL | Status: DC | PRN

## 2012-03-19 MED ORDER — PHENYLEPHRINE 40 MCG/ML (10ML) SYRINGE FOR IV PUSH (FOR BLOOD PRESSURE SUPPORT): 80.0000 ug | PREFILLED_SYRINGE | INTRAVENOUS | Status: DC | PRN

## 2012-03-19 MED ORDER — DIPHENHYDRAMINE HCL 50 MG/ML IJ SOLN: 12.5000 mg | INTRAMUSCULAR | Status: DC | PRN

## 2012-03-19 MED ORDER — DIBUCAINE 1 % RE OINT
1.0000 "application " | TOPICAL_OINTMENT | RECTAL | Status: DC | PRN
  Administered 2012-03-20: 1 via RECTAL
  Filled 2012-03-19: qty 28

## 2012-03-19 MED ORDER — ONDANSETRON HCL 4 MG/2ML IJ SOLN
4.0000 mg | Freq: Four times a day (QID) | INTRAMUSCULAR | Status: DC | PRN
  Administered 2012-03-19: 4 mg via INTRAVENOUS
  Filled 2012-03-19: qty 2

## 2012-03-19 MED ORDER — PHENYLEPHRINE 40 MCG/ML (10ML) SYRINGE FOR IV PUSH (FOR BLOOD PRESSURE SUPPORT)
80.0000 ug | PREFILLED_SYRINGE | INTRAVENOUS | Status: DC | PRN
  Filled 2012-03-19: qty 5

## 2012-03-19 MED ORDER — IBUPROFEN 600 MG PO TABS
600.0000 mg | ORAL_TABLET | Freq: Four times a day (QID) | ORAL | Status: DC
  Administered 2012-03-19 – 2012-03-20 (×4): 600 mg via ORAL
  Filled 2012-03-19 (×4): qty 1

## 2012-03-19 MED ORDER — SENNOSIDES-DOCUSATE SODIUM 8.6-50 MG PO TABS
2.0000 | ORAL_TABLET | Freq: Every day | ORAL | Status: DC
  Administered 2012-03-19: 2 via ORAL

## 2012-03-19 MED ORDER — EPHEDRINE 5 MG/ML INJ
10.0000 mg | INTRAVENOUS | Status: DC | PRN
  Filled 2012-03-19: qty 4

## 2012-03-19 MED ORDER — LIDOCAINE HCL (PF) 1 % IJ SOLN: 30.0000 mL | INTRAMUSCULAR | Status: DC | PRN

## 2012-03-19 MED ORDER — OXYCODONE-ACETAMINOPHEN 5-325 MG PO TABS
1.0000 | ORAL_TABLET | ORAL | Status: DC | PRN
  Administered 2012-03-20: 2 via ORAL
  Administered 2012-03-20: 1 via ORAL
  Administered 2012-03-20: 2 via ORAL
  Administered 2012-03-20: 1 via ORAL
  Administered 2012-03-20: 2 via ORAL
  Filled 2012-03-19 (×2): qty 2
  Filled 2012-03-19: qty 1
  Filled 2012-03-19 (×2): qty 2
  Filled 2012-03-19: qty 1

## 2012-03-19 MED ORDER — TERBUTALINE SULFATE 1 MG/ML IJ SOLN: 0.2500 mg | Freq: Once | INTRAMUSCULAR | Status: DC | PRN

## 2012-03-19 MED ORDER — CITRIC ACID-SODIUM CITRATE 334-500 MG/5ML PO SOLN: 30.0000 mL | ORAL | Status: DC | PRN

## 2012-03-19 MED ORDER — ZOLPIDEM TARTRATE 5 MG PO TABS: 5.0000 mg | ORAL_TABLET | Freq: Every evening | ORAL | Status: DC | PRN

## 2012-03-19 MED ORDER — ONDANSETRON HCL 4 MG/2ML IJ SOLN: 4.0000 mg | INTRAMUSCULAR | Status: DC | PRN

## 2012-03-19 MED ORDER — LACTATED RINGERS IV SOLN: 500.0000 mL | Freq: Once | INTRAVENOUS | Status: DC

## 2012-03-19 MED ORDER — IBUPROFEN 600 MG PO TABS: 600.0000 mg | ORAL_TABLET | Freq: Four times a day (QID) | ORAL | Status: DC | PRN

## 2012-03-19 MED ORDER — LACTATED RINGERS IV SOLN
INTRAVENOUS | Status: DC
  Administered 2012-03-19 (×3): 1000 mL via INTRAVENOUS

## 2012-03-19 MED ORDER — OXYCODONE-ACETAMINOPHEN 5-325 MG PO TABS: 1.0000 | ORAL_TABLET | ORAL | Status: DC | PRN

## 2012-03-19 MED ORDER — OXYTOCIN BOLUS FROM INFUSION
250.0000 mL | Freq: Once | INTRAVENOUS | Status: DC
  Filled 2012-03-19: qty 500

## 2012-03-19 MED ORDER — PRENATAL MULTIVITAMIN CH
1.0000 | ORAL_TABLET | Freq: Every day | ORAL | Status: DC
  Administered 2012-03-20: 1 via ORAL
  Filled 2012-03-19: qty 1

## 2012-03-19 MED ORDER — BENZOCAINE-MENTHOL 20-0.5 % EX AERO
1.0000 "application " | INHALATION_SPRAY | CUTANEOUS | Status: DC | PRN
  Administered 2012-03-19 – 2012-03-20 (×2): 1 via TOPICAL
  Filled 2012-03-19 (×2): qty 56

## 2012-03-19 MED ORDER — LACTATED RINGERS IV SOLN
500.0000 mL | INTRAVENOUS | Status: DC | PRN
  Administered 2012-03-19: 500 mL via INTRAVENOUS

## 2012-03-19 MED ORDER — EPHEDRINE 5 MG/ML INJ: 10.0000 mg | INTRAVENOUS | Status: DC | PRN

## 2012-03-19 MED ORDER — OXYTOCIN 40 UNITS IN LACTATED RINGERS INFUSION - SIMPLE MED
62.5000 mL/h | Freq: Once | INTRAVENOUS | Status: AC
  Administered 2012-03-19: 45 m[IU]/min via INTRAVENOUS
  Filled 2012-03-19: qty 1000

## 2012-03-19 MED ORDER — LANOLIN HYDROUS EX OINT: TOPICAL_OINTMENT | CUTANEOUS | Status: DC | PRN

## 2012-03-19 MED ORDER — DIPHENHYDRAMINE HCL 25 MG PO CAPS
25.0000 mg | ORAL_CAPSULE | Freq: Four times a day (QID) | ORAL | Status: DC | PRN
  Administered 2012-03-19: 25 mg via ORAL
  Filled 2012-03-19: qty 1

## 2012-03-19 MED ORDER — SIMETHICONE 80 MG PO CHEW: 80.0000 mg | CHEWABLE_TABLET | ORAL | Status: DC | PRN

## 2012-03-19 NOTE — Anesthesia Preprocedure Evaluation (Signed)

## 2012-03-19 NOTE — Progress Notes (Signed)
Having regular contractions on pitocin.  Cervix is now 2 - 3/80 % effaced.  AROM clear fluid.  IUPC placed.  FHT's category 1.

## 2012-03-19 NOTE — H&P (Signed)
  36 y.o. G2P1001  Estimated Date of Delivery: 04/01/12 admitted at [redacted] weeks gestation for induction.  Prenatal Transfer Tool  Maternal Diabetes: No Genetic Screening: Normal Maternal Ultrasounds/Referrals: Normal fetal anatomy screen, anterior low lying placenta, Ultrasound at 34 weeks showed symmetric, isolated FGR. Fetal Ultrasounds or other Referrals:  None Maternal Substance Abuse:  No Significant Maternal Medications:  None Significant Maternal Lab Results: None Other Significant Pregnancy Complications:  Isolated FGR (normal umbilical cord doppler studies, normal AFI, normal NST); Previous Cesarean.  Afebrile, VSS Heart and Lungs: No active disease Abdomen: soft, gravid, EFW 5 lbs. Cervical exam:  1-2/70, vtx -1.5  Impression: FGR, favorable cervix, previous cesarean delivery for breech.  Plan:  IV pitocin induction and close observation.  TOLAC

## 2012-03-20 LAB — CBC
HCT: 32.6 % — ABNORMAL LOW (ref 36.0–46.0)
Hemoglobin: 10.7 g/dL — ABNORMAL LOW (ref 12.0–15.0)
MCH: 30 pg (ref 26.0–34.0)
MCV: 91.3 fL (ref 78.0–100.0)
RBC: 3.57 MIL/uL — ABNORMAL LOW (ref 3.87–5.11)

## 2012-03-20 MED ORDER — LIDOCAINE HCL (PF) 1 % IJ SOLN
INTRAMUSCULAR | Status: DC | PRN
  Administered 2012-03-19 (×2): 4 mL

## 2012-03-20 MED ORDER — HYDROCORTISONE ACE-PRAMOXINE 1-1 % RE FOAM
1.0000 | Freq: Two times a day (BID) | RECTAL | Status: DC
Start: 2012-03-20 — End: 2012-03-20
  Administered 2012-03-20: 1 via RECTAL
  Filled 2012-03-20: qty 10

## 2012-03-20 MED ORDER — HYDROCORTISONE ACE-PRAMOXINE 1-1 % RE FOAM: 1.0000 | Freq: Two times a day (BID) | RECTAL | Status: AC

## 2012-03-20 MED ORDER — BENZOCAINE-MENTHOL 20-0.5 % EX AERO: 1.0000 "application " | INHALATION_SPRAY | CUTANEOUS | Status: DC | PRN

## 2012-03-20 MED ORDER — IBUPROFEN 600 MG PO TABS: 600.0000 mg | ORAL_TABLET | Freq: Four times a day (QID) | ORAL | Status: AC

## 2012-03-20 MED ORDER — FENTANYL 2.5 MCG/ML BUPIVACAINE 1/10 % EPIDURAL INFUSION (WH - ANES)
INTRAMUSCULAR | Status: DC | PRN
  Administered 2012-03-19 (×2): 13 mL/h via EPIDURAL

## 2012-03-20 MED ORDER — HYDROCODONE-ACETAMINOPHEN 5-500 MG PO TABS: 1.0000 | ORAL_TABLET | ORAL | Status: AC | PRN

## 2012-03-20 NOTE — Anesthesia Postprocedure Evaluation (Signed)
   Anesthesia Post Note  Patient: Cynthia Baxter  Procedure(s) Performed: * No procedures listed *  Anesthesia type: Epidural  Patient location: Mother/Baby  Post pain: Pain level controlled  Post assessment: Post-op Vital signs reviewed  Last Vitals:  Filed Vitals:   03/20/12 0629  BP: 97/64  Pulse: 84  Temp: 36.4 C  Resp: 18    Post vital signs: Reviewed  Level of consciousness:alert  Complications: No apparent anesthesia complications

## 2012-03-20 NOTE — Progress Notes (Signed)
Post Partum Day 1 Subjective: no complaints, up ad lib, voiding and tolerating PO, notes hemorrhoid discomfort  Objective: Filed Vitals:   03/19/12 2041 03/19/12 2140 03/20/12 0140 03/20/12 0629  BP: 102/67 97/59 97/62  97/64  Pulse: 86 79 80 84  Temp: 98.1 F (36.7 C) 98 F (36.7 C) 98.3 F (36.8 C) 97.5 F (36.4 C)  TempSrc: Oral Oral Oral Oral  Resp: 18 18 18 18   Height:      Weight:      SpO2:        Physical Exam:  General: alert, cooperative and appears stated age Lochia: appropriate Uterine Fundus: firm    Basename 03/20/12 0535 03/19/12 0735  HGB 10.7* 12.2  HCT 32.6* 36.5    Assessment/Plan: Breastfeeding Patient considering D/C home today.  Await peds release of baby.   Proctofoam for hemorrhoids   LOS: 1 day   Win Guajardo H. 03/20/2012, 10:12 AM

## 2012-03-20 NOTE — Discharge Summary (Signed)
Obstetric Discharge Summary Reason for Admission: induction of labor Prenatal Procedures: NST and ultrasound Intrapartum Procedures: spontaneous vaginal delivery Postpartum Procedures: none Complications-Operative and Postpartum: none Hemoglobin  Date Value Range Status  03/20/2012 10.7* 12.0 - 15.0 g/dL Final     HCT  Date Value Range Status  03/20/2012 32.6* 36.0 - 46.0 % Final    Physical Exam:  General: alert, cooperative and appears stated age 36: appropriate Uterine Fundus: firm  Discharge Diagnoses: Term Pregnancy-delivered  Discharge Information: Date: 03/20/2012 Activity: pelvic rest Diet: routine Medications: Ibuprofen, Colace, Vicodin and Proctofoam Condition: stable Instructions: refer to practice specific booklet Discharge to: home Follow-up Information    Follow up with Mickel Baas, MD. Call in 4 weeks. (For a postpartum evaluation)    Contact information:   [address] [place] [address] [phone]          Newborn Data: Live born female  Birth Weight: 5 lb 15 oz (2693 g) APGAR: 9, 9  Home with mother.  Din Bookwalter H. 03/20/2012, 6:29 PM

## 2012-10-28 ENCOUNTER — Ambulatory Visit: Payer: BC Managed Care – PPO | Admitting: Internal Medicine

## 2012-10-28 DIAGNOSIS — Z0289 Encounter for other administrative examinations: Secondary | ICD-10-CM

## 2013-09-30 ENCOUNTER — Ambulatory Visit: Payer: Self-pay | Admitting: Family Medicine

## 2013-10-02 ENCOUNTER — Encounter: Payer: Self-pay | Admitting: *Deleted

## 2013-10-05 ENCOUNTER — Ambulatory Visit (INDEPENDENT_AMBULATORY_CARE_PROVIDER_SITE_OTHER): Payer: BC Managed Care – PPO | Admitting: Cardiovascular Disease

## 2013-10-05 ENCOUNTER — Encounter: Payer: Self-pay | Admitting: Cardiovascular Disease

## 2013-10-05 VITALS — BP 118/80 | HR 97 | Ht 62.0 in | Wt 127.5 lb

## 2013-10-05 DIAGNOSIS — I498 Other specified cardiac arrhythmias: Secondary | ICD-10-CM

## 2013-10-05 DIAGNOSIS — R000 Tachycardia, unspecified: Secondary | ICD-10-CM

## 2013-10-05 DIAGNOSIS — R0789 Other chest pain: Secondary | ICD-10-CM

## 2013-10-05 MED ORDER — METOPROLOL TARTRATE 25 MG PO TABS: 12.5000 mg | ORAL_TABLET | Freq: Two times a day (BID) | ORAL | Status: DC

## 2013-10-05 NOTE — Progress Notes (Signed)
     Cynthia Baxter Date of Birth  01/23/1976       [facility]    [place] [facility] [address], [address]  [address] [place], [place]  [id number]   [place]  [id number] [phone]     [phone]   Fax  [phone]    Fax [phone]  Problem List: 1. Sinus tachycardia 2 ADD  History of Present Illness:  Cynthia Baxter presents today for evaluation of some chest tightness and some tachycardia.    She had pleurietic CP,   She also has had some chest pressure.    Over the past several days, she has improved.  She had a URI last week.    She has not had any medical issues.  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   She has 2 children ( 741 and 39 years old.)  She has a + family hx of CAD - father and grand parents.   Current Outpatient Prescriptions on File Prior to Visit  Medication Sig Dispense Refill  . amoxicillin-clavulanate (AUGMENTIN) 500-125 MG per tablet Take 1 tablet by mouth every 12 (twelve) hours.      Marland Kitchen. amphetamine-dextroamphetamine (ADDERALL XR) 25 MG 24 hr capsule Take 25 mg by mouth every morning.      . Multiple Vitamins tablet Take 1 tablet by mouth daily.       No current facility-administered medications on file prior to visit.    Allergies  Allergen Reactions  . Sulfa Antibiotics     Past Medical History  Diagnosis Date  . Attention deficit disorder without mention of hyperactivity   . Acute sinusitis, unspecified   . Attention deficit disorder without mention of hyperactivity   . Other and unspecified mycoses   . Pharyngitis   . Acute nonsuppurative otitis media, unspecified     Past Surgical History  Procedure Laterality Date  . Knee arthroscopy Right   . Cesarean section      History  Smoking status  . Never Smoker   Smokeless tobacco  . Not on file    History  Alcohol Use No    Family History  Problem Relation Age of Onset  . Heart disease Father [id number]    MI  . Stroke Maternal Grandmother   .  Heart attack Maternal Grandmother   . Heart disease Maternal Grandmother   . Hypertension Mother   . Asthma Mother     Reviw of Systems:  Reviewed in the HPI.  All other systems are negative.  Physical Exam: Blood pressure 118/80, pulse 97, height 5\' 2"  (1.575 m), weight 127 lb 8 oz (57.834 kg). General: Well developed, well nourished, in no acute distress.  Head: Normocephalic, atraumatic, sclera non-icteric, mucus membranes are moist,   Neck: Supple. Carotids are 2 + without bruits. No JVD   Lungs: she had a soft pleural rub on her left side.  Otherwise, her lungs are clear  Heart: RR, 1/6 systolic murmur  Abdomen: Soft, non-tender, non-distended with normal bowel sounds.  Msk:  Strength and tone are normal   Extremities: No clubbing or cyanosis. No edema.  Distal pedal pulses are 2+ and equal    Neuro: CN II - XII intact.  Alert and oriented X 3.   Psych:  Normal   ECG: NSR at 97.normal ECG.  Assessment / Plan:

## 2013-10-05 NOTE — Patient Instructions (Signed)
Your physician has recommended you make the following change in your medication:  Start Metoprolol 12.5 mg (half of a 25 mg tablet) two times a day   Your physician recommends that you return for lab work in:  Today  Lipid panel   Your physician recommends that you schedule a follow-up appointment in:  1 month. You will have an EKG that visit.

## 2013-10-05 NOTE — Assessment & Plan Note (Signed)
Cynthia Baxter  presents for further evaluation of her tachycardia and some chest discomfort. These were diagnosed after she had an upper respiratory tract infection. She still has a pleural rub on her left side and I suspect that she had pleurisy. Her tachycardia is probably a combination of her Adderall therapy as well as her upper respiratory tract infection.  At this point I do not think that we should stop her Adderall. Will start metoprolol titrate 12.5 mg twice a day. We'll see her again in the office in one month for followup visit.  She does have a strong family history of coronary artery disease. Will check fasting lipids today.

## 2013-10-06 LAB — LIPID PANEL
CHOLESTEROL TOTAL: 174 mg/dL (ref 100–199)
Chol/HDL Ratio: 2.9 ratio units (ref 0.0–4.4)
HDL: 59 mg/dL (ref >39)
LDL Calculated: 97 mg/dL (ref 0–99)
Triglycerides: 89 mg/dL (ref 0–149)
VLDL CHOLESTEROL CAL: 18 mg/dL (ref 5–40)

## 2013-10-29 ENCOUNTER — Encounter: Payer: Self-pay | Admitting: Podiatrist

## 2013-10-29 ENCOUNTER — Ambulatory Visit (INDEPENDENT_AMBULATORY_CARE_PROVIDER_SITE_OTHER): Payer: BC Managed Care – PPO | Admitting: Podiatrist

## 2013-10-29 VITALS — BP 129/85 | HR 97 | Resp 16 | Ht 63.0 in | Wt 122.0 lb

## 2013-10-29 DIAGNOSIS — L600 Ingrowing nail: Secondary | ICD-10-CM

## 2013-10-29 NOTE — Progress Notes (Signed)
   Subjective:    Patient ID: Cynthia Baxter, female    DOB: 02/08/1976, 38 y.o.   MRN: [id number]  HPI Comments: Pt states i think i have ingrown toenails, both feet all the toenails , seems to be sore and puffy.  This has been since i have had my baby but has got worse the last 4-5 months. When the nails grow out is when it hurts the most. And feels better when i wear shoes. The toes go numb and tingly when its cold .   Toe Pain       Review of Systems  Cardiovascular:       Tachycardia   Musculoskeletal:       Sciatica   Psychiatric/Behavioral:       Adhd  All other systems reviewed and are negative.       Objective:   Physical Exam GENERAL APPEARANCE: Alert, conversant. Appropriately groomed. No acute distress.  VASCULAR: Pedal pulses palpable at 1/4 DP and PT bilateral.  Capillary refill time is immediate to all digits,  Proximal to distal cooling it warm to cool.  Purplish discoloration present to all digits-- Raynauds syndrome favored  NEUROLOGIC: sensation is intact epicritically and protectively to 5.07 monofilament at 5/5 sites bilateral.  Light touch is intact bilateral, vibratory sensation intact bilateral, achilles tendon reflex is intact bilateral.  MUSCULOSKELETAL: acceptable muscle strength, tone and stability bilateral.  Intrinsic muscluature intact bilateral.  Rectus appearance of foot and digits noted bilateral.   DERMATOLOGIC: purplish discoloration and cool temperature to skin is noted.  Skin itself is normal in texture and turgor.  Painful ingrowing 2nd toenail on the right foot is noted.  Incurvated edges present.  Left foot has a painful 2nd and 3rd toenail.  Also relates tingling of toes similar to frostbite type feelings.    Assessment & Plan:  Ingrown toenail - right 2nd toe Raynauds phenomenon  Plan:  Recommended phenol matrixectomy on the right 2nd toenail only today. Right 2nd toe was prepped with alcohol and a 1 to 1 mix of 0.5% marcaine plain and 2%  lidocaine plain was administered in a digital block fashion.  The toe was then prepped with betadine solution and exsanguinated.  The offending nail border was then excised and matrix tissue exposed.  Phenol was then applied to the matrix tissue followed by an alcohol wash.  Antibiotic ointment and a dry sterile dressing was applied.  The patient was dispensed instructions for aftercare.   An appointment for vascular lab was set up through [facility] to decipher if patient has Raynauds Disease.  I will see her back in 1 week for recheck.  Patient given information about Raynauds and instructed to be vigilant about keeping her feet warm with socks and shoes.

## 2013-10-29 NOTE — Progress Notes (Deleted)
Right 2nd toe ingrown toenail.  Raynauds.

## 2013-10-29 NOTE — Patient Instructions (Addendum)
ANTIBACTERIAL SOAP INSTRUCTIONS  THE DAY AFTER PROCEDURE     Shower as usual. Before getting out, place a drop of antibacterial liquid soap (Dial) on a wet, clean washcloth.  Gently wipe washcloth over affected area.  Afterward, rinse the area with warm water.  Blot the area dry with a soft cloth and cover with antibiotic ointment (neosporin, polysporin, bacitracin) and band aid or gauze and tape  OR   Place 3-4 drops of antibacterial liquid soap in a quart of warm tap water.  Submerge foot into water for 20 minutes.  If bandage was applied after your procedure, leave on to allow for easy lift off, then remove and continue with soak for the remaining time.  Next, blot area dry with a soft cloth and cover with a bandage.  Apply other medications as directed by your doctor, such as cortisporin otic solution (eardrops) or neosporin antibiotic ointment    Raynaud's Syndrome Raynaud's Syndrome is a disorder of the blood vessels in your hands and feet. It occurs when small arteries of the arms/hands or legs/feet become sensitive to cold or emotional upset. This causes the arteries to constrict, or narrow, and reduces blood flow to the area. The color in the fingers or toes changes from white to bluish to red and this is not usually painful. There may be numbness and tingling. Sores on the skin (ulcers) can form. Symptoms are usually relieved by warming. HOME CARE INSTRUCTIONS   Avoid exposure to cold. Keep your whole body warm and dry. Dress in layers. Wear mittens or gloves when handling ice or frozen food and when outdoors. Use holders for glasses or cans containing cold drinks. If possible, stay indoors during cold weather.  Limit your use of caffeine. Switch to decaffeinated coffee, tea, and soda pop. Avoid chocolate.  Avoid smoking or being around cigarette smoke. Smoke will make symptoms worse.  Wear loose fitting socks and comfortable, roomy shoes.  Avoid vibrating tools and  machinery.  If possible, avoid stressful and emotional situations. Exercise, meditation and yoga may help you cope with stress. Biofeedback may be useful.  Ask your caregiver about medicine (calcium channel blockers) that may control Raynaud's phenomena. SEEK MEDICAL CARE IF:   Your discomfort becomes worse, despite conservative treatment.  You develop sores on your fingers and toes that do not heal. Document Released: 08/24/2000 Document Revised: 11/19/2011 Document Reviewed: 08/31/2008 [facility] Patient Information 2014 [employer] [place].   We are setting up an appointment for you to be tested at the vascular lab at [facility]-- we will set up the appointment for you and call you with the date and time of this appointment.

## 2013-10-30 ENCOUNTER — Telehealth: Payer: Self-pay

## 2013-10-30 NOTE — Telephone Encounter (Signed)
Pt called and states she had to go to a foot doctor and states she had some numbness in her feet and hands, podiatrist thinks she has raydan syndrome. She states she may be "paranoid" and wants to know if this could be coming from a "heart condition".She read this could be one of the causes. Please call.

## 2013-10-30 NOTE — Telephone Encounter (Signed)
Informed patient that per Dr. Elease HashimotoNahser they can discuss this at her next appt and that this is not a primary cardiac problem. Patient verbalized understanding.

## 2013-10-30 NOTE — Telephone Encounter (Signed)
Raynauds disease (if that is what she is talking about) is due to the cold.  I am out of the office today.  We can address this next week.  This is not a primary cardiac problem.

## 2013-11-05 ENCOUNTER — Ambulatory Visit: Payer: BC Managed Care – PPO | Admitting: Podiatrist

## 2013-11-09 ENCOUNTER — Ambulatory Visit: Payer: BC Managed Care – PPO | Admitting: Cardiovascular Disease

## 2013-11-30 ENCOUNTER — Ambulatory Visit: Payer: BC Managed Care – PPO | Admitting: Cardiovascular Disease

## 2014-01-20 ENCOUNTER — Ambulatory Visit: Payer: Self-pay | Admitting: Family Medicine

## 2014-02-24 ENCOUNTER — Ambulatory Visit: Payer: Self-pay | Admitting: Family Medicine

## 2014-03-30 ENCOUNTER — Ambulatory Visit: Payer: Self-pay | Admitting: Unknown Physician Specialty

## 2014-03-30 HISTORY — PX: THYROIDECTOMY: SHX17

## 2014-03-31 LAB — PATHOLOGY REPORT

## 2014-07-12 ENCOUNTER — Encounter (HOSPITAL_COMMUNITY): Payer: Self-pay

## 2014-08-06 ENCOUNTER — Encounter (HOSPITAL_COMMUNITY): Payer: Self-pay

## 2014-11-15 ENCOUNTER — Encounter: Payer: Self-pay | Admitting: Cardiovascular Disease

## 2014-11-15 ENCOUNTER — Ambulatory Visit (INDEPENDENT_AMBULATORY_CARE_PROVIDER_SITE_OTHER): Payer: BLUE CROSS/BLUE SHIELD | Admitting: Cardiovascular Disease

## 2014-11-15 VITALS — BP 104/78 | HR 71 | Ht 63.0 in | Wt 140.8 lb

## 2014-11-15 DIAGNOSIS — R011 Cardiac murmur, unspecified: Secondary | ICD-10-CM

## 2014-11-15 DIAGNOSIS — I471 Supraventricular tachycardia: Secondary | ICD-10-CM

## 2014-11-15 DIAGNOSIS — I351 Nonrheumatic aortic (valve) insufficiency: Secondary | ICD-10-CM

## 2014-11-15 DIAGNOSIS — R000 Tachycardia, unspecified: Secondary | ICD-10-CM

## 2014-11-15 DIAGNOSIS — I38 Endocarditis, valve unspecified: Secondary | ICD-10-CM

## 2014-11-15 NOTE — Patient Instructions (Signed)
Your physician recommends that you continue on your current medications as directed. Please refer to the Current Medication list given to you today.  Your physician has requested that you have an echocardiogram. Echocardiography is a painless test that uses sound waves to create images of your heart. It provides your doctor with information about the size and shape of your heart and how well your heart's chambers and valves are working. This procedure takes approximately one hour. There are no restrictions for this procedure.   Your physician recommends that you schedule a follow-up appointment in: 1 YEAR WITH DR. Elease HashimotoNAHSER

## 2014-11-15 NOTE — Progress Notes (Signed)
Cardiology Office Note   Date:  11/15/2014   ID:  Anecia Nusbaum, DOB [birth year], MRN [id number]  PCP:  Brayton El, MD  Cardiologist:   Vesta Mixer, MD   Chief Complaint  Patient presents with  . Follow-up    sinus tach   1. Sinus tachycardia 2 ADD   History of Present Illness: Cynthia Baxter is a 39 y.o. female who presents for follow-up of her episodes of tachycardia. She stopped her adderall and here tachycardia has resolved. She has been diagnosed with hypothyroidism and has gained some weight. Is now exercising.    Past Medical History  Diagnosis Date  . Attention deficit disorder without mention of hyperactivity   . Acute sinusitis, unspecified   . Attention deficit disorder without mention of hyperactivity   . Other and unspecified mycoses   . Pharyngitis   . Acute nonsuppurative otitis media, unspecified   . No pertinent past medical history   . H/O varicella   . AMA (advanced maternal age) multigravida 35+   . Recurrent UTI (urinary tract infection)     Past Surgical History  Procedure Laterality Date  . Cesarean section    . Cesarean section    . Knee arthroscopy       Current Outpatient Prescriptions  Medication Sig Dispense Refill  . levothyroxine (SYNTHROID, LEVOTHROID) 25 MCG tablet Take 25 mcg by mouth daily.  2  . Multiple Vitamins tablet Take 1 tablet by mouth daily.     No current facility-administered medications for this visit.    Allergies:   Sulfa antibiotics and Sulfa antibiotics    Social History:  The patient  reports that she has quit smoking. She has never used smokeless tobacco. She reports that she does not drink alcohol or use illicit drugs.   Family History:  The patient's family history includes Anxiety disorder in her mother; Asthma in her mother; COPD in her mother; Cancer in her maternal grandmother; Heart attack in her maternal grandmother; Heart disease in her father and maternal grandmother; Heart  disease (age of onset: 61) in her father; Hypertension in her mother; Stroke in her maternal grandmother. There is no history of Other.    ROS:  Please see the history of present illness.    Review of Systems: Constitutional:  denies fever, chills, diaphoresis, appetite change and fatigue.  HEENT: denies photophobia, eye pain, redness, hearing loss, ear pain, congestion, sore throat, rhinorrhea, sneezing, neck pain, neck stiffness and tinnitus.  Respiratory: denies SOB, DOE, cough, chest tightness, and wheezing.  Cardiovascular: denies chest pain, palpitations and leg swelling.  Gastrointestinal: denies nausea, vomiting, abdominal pain, diarrhea, constipation, blood in stool.  Genitourinary: denies dysuria, urgency, frequency, hematuria, flank pain and difficulty urinating.  Musculoskeletal: denies  myalgias, back pain, joint swelling, arthralgias and gait problem.   Skin: denies pallor, rash and wound.  Neurological: denies dizziness, seizures, syncope, weakness, light-headedness, numbness and headaches.   Hematological: denies adenopathy, easy bruising, personal or family bleeding history.  Psychiatric/ Behavioral: denies suicidal ideation, mood changes, confusion, nervousness, sleep disturbance and agitation.       All other systems are reviewed and negative.    PHYSICAL EXAM: VS:  BP 104/78 mmHg  Pulse 71  Ht  (1.6 m)  Wt 140 lb 12.8 oz (63.866 kg)  BMI 24.95 kg/m2 , BMI Body mass index is 24.95 kg/(m^2). GEN: Well nourished, well developed, in no acute distress HEENT: normal Neck: no JVD, carotid bruits, or masses Cardiac: RRR; ? Very  soft diastolic murmur,  No  rubs, or gallops,no edema  Respiratory:  clear to auscultation bilaterally, normal work of breathing GI: soft, nontender, nondistended, + BS MS: no deformity or atrophy Skin: warm and dry, no rash Neuro:  Strength and sensation are intact Psych: normal   EKG:  EKG is ordered today. The ekg ordered today  demonstrates:   NSR at 71 normal ECG    Recent Labs: No results found for requested labs within last 365 days.    Lipid Panel    Component Value Date/Time   CHOL 174 10/05/2013 1117   TRIG 89 10/05/2013 1117   HDL 59 10/05/2013 1117   CHOLHDL 2.9 10/05/2013 1117   LDLCALC 97 10/05/2013 1117      Wt Readings from Last 3 Encounters:  11/15/14 140 lb 12.8 oz (63.866 kg)  10/29/13 122 lb (55.339 kg)  10/05/13 127 lb 8 oz (57.834 kg)      Other studies Reviewed: Additional studies/ records that were reviewed today include: . Review of the above records demonstrates:    ASSESSMENT AND PLAN:  1.  Sinus tachycardia:  Her sinus tachycardia has resolved that she stopped taking the Adderall. She never started taking the metoprolol.  I'm happy that the sinus tachycardia has resolved and she did not have to add additional medications.  2. Soft diastolic murmur:   she may have very mild aortic insufficiency. We'll get an echocardiogram for further evaluation of this. I'll see her again in one year.    Current medicines are reviewed at length with the patient today.  The patient does not have concerns regarding medicines.  The following changes have been made:  no change   Disposition:   FU with me in 1 year.     Signed, Nahser, Deloris PingPhilip J, MD  11/15/2014 3:56 PM    [facility] [address], [place], [place]  [address] Phone: [phone]; Fax: [phone]

## 2014-12-06 ENCOUNTER — Other Ambulatory Visit (INDEPENDENT_AMBULATORY_CARE_PROVIDER_SITE_OTHER): Payer: BLUE CROSS/BLUE SHIELD

## 2014-12-06 ENCOUNTER — Other Ambulatory Visit: Payer: Self-pay

## 2014-12-06 DIAGNOSIS — I471 Supraventricular tachycardia: Secondary | ICD-10-CM | POA: Diagnosis not present

## 2014-12-06 DIAGNOSIS — R011 Cardiac murmur, unspecified: Secondary | ICD-10-CM | POA: Diagnosis not present

## 2014-12-06 DIAGNOSIS — I38 Endocarditis, valve unspecified: Secondary | ICD-10-CM

## 2015-01-01 NOTE — Op Note (Signed)
PATIENT NAME:  Cynthia Baxter, Cynthia Baxter MR#:  [id number] DATE OF BIRTH:  May 04, 1976  DATE OF PROCEDURE:  03/30/2014  PREOPERATIVE DIAGNOSIS: Left thyroid cyst.  POSTOPERATIVE DIAGNOSIS: Left thyroid cyst.  PROCEDURES PERFORMED:  1.  Left hemithyroidectomy.  2.  Laryngeal nerve monitoring for 1 hour.  SURGEON: Linus Salmonshapman [place] Nolton, MD  ASSISTANT SURGEON: Vernie MurdersPaul Juengel, MD  ANESTHESIA: General endotracheal.   OPERATIVE FINDINGS: Large cystic mass measuring approximately 4 x 5 cm, left lobe of thyroid.   DESCRIPTION OF PROCEDURE: Bettyanne was identified in the holding area, taken to the operating room and placed in the supine position. After general endotracheal anesthesia was done, using a laryngeal nerve monitor, the patient was intubated with the endotracheal tube wrapped with laryngeal monitoring. With this in position, a natural skin crease was identified and marked just beneath the cricoid cartilage, in the midline. The neck was gently extended. The skin line was marked. A local anesthetic of 1% lidocaine with 1:100,000 epinephrine was used to inject along the skin crease. A total of 4 mL was used. The neck was then prepped and draped sterilely. A 15 blade was used to incise down to and through the platysma muscle. Hemostasis was achieved using the Bovie cautery. Strap muscles were identified in the midline. A large cystic mass was identified within the left lobe of the thyroid. The strap muscles were dissected off of the thyroid lobe on the left-hand side. There were multiple feeding vessels into the lobe which were divided using the Harmonic scalpel. The superior pole vessels were isolated and divided using the Harmonic scalpel and the gland was gently medialized. The superior and inferior parathyroid glands were identified and left on their vascular pedicles. The recurrent laryngeal nerve was identified in the tracheoesophageal groove. This was stimulated and remained intact throughout the case. As the gland  was gently dissected off from the nerve and off the anterior tracheal wall, Berry's ligament was released. This allowed the gland to free up medially. The pyramidal lobe was gently dissected away using the Harmonic scalpel and as the isthmus was dissected the gland was transected just to the right of midline using the Harmonic scalpel. This removed the entire left lobe and the isthmus and pyramidal lobe. This was sent for permanent section. The wound was copiously irrigated with saline. The parathyroid glands appeared to be healthy. The nerve was stimulated at the end of the case and remained intact. The wound was then copiously irrigated once again. There was no evidence of active bleeding noted at all. Surgicel was then placed in the neurovascular bed. The strap muscles were reapproximated in the midline using 4-0 Vicryl. The platysmal layer was closed using 4-0 Vicryl, subcutaneous tissues were closed using 4-0 Vicryl, and the skin was closed using Dermabond. The patient was then returned to anesthesia where she was awakened in the operating room and taken to the recovery room in stable condition.   CULTURES: None.   SPECIMENS: Left lobe of thyroid.   ESTIMATED BLOOD LOSS: Less than 20 mL.  ____________________________ Davina Pokehapman T. Raydell Maners, MD ctm:sb D: 03/30/2014 08:24:14 ET T: 03/30/2014 10:45:47 ET JOB#: [id number]  cc: Davina Pokehapman T. Terriyah Westra, MD, <Dictator> Davina PokeHAPMAN T Sariah Henkin MD ELECTRONICALLY SIGNED 04/06/2014 18:19

## 2015-03-30 ENCOUNTER — Encounter: Payer: Self-pay | Admitting: Family Medicine

## 2015-03-30 ENCOUNTER — Ambulatory Visit (INDEPENDENT_AMBULATORY_CARE_PROVIDER_SITE_OTHER): Payer: BLUE CROSS/BLUE SHIELD | Admitting: Family Medicine

## 2015-03-30 VITALS — BP 100/70 | HR 83 | Temp 98.1°F | Resp 17 | Ht 63.0 in | Wt 141.2 lb

## 2015-03-30 DIAGNOSIS — R599 Enlarged lymph nodes, unspecified: Secondary | ICD-10-CM

## 2015-03-30 DIAGNOSIS — R2242 Localized swelling, mass and lump, left lower limb: Secondary | ICD-10-CM | POA: Insufficient documentation

## 2015-03-30 DIAGNOSIS — F988 Other specified behavioral and emotional disorders with onset usually occurring in childhood and adolescence: Secondary | ICD-10-CM | POA: Insufficient documentation

## 2015-03-30 MED ORDER — CEPHALEXIN 500 MG PO CAPS: 500.0000 mg | ORAL_CAPSULE | Freq: Two times a day (BID) | ORAL | Status: DC

## 2015-03-30 NOTE — Progress Notes (Signed)
Name: Cynthia NicelyChele Carter Mcevoy   MRN: [id number]    DOB: 05/18/1976   Date:03/30/2015       Progress Note  Subjective  Chief Complaint  Chief Complaint  Patient presents with  . Acute Visit    knot on Neck and left foot     HPI Pt. Is here for a hard mass noticed on the left side of her neck by the patient, her spouse, and her father-in-law. It has been present for over a month, but no change in size. Does not hurt to touch. No recent illnesses but has allergies. Also presents for evaluation of a painful round mass over her left foot, present for over 3 weeks, and bothers her when she walks. No drainage visible, no fevers or chills.  Past Medical History  Diagnosis Date  . Attention deficit disorder without mention of hyperactivity   . Acute sinusitis, unspecified   . Attention deficit disorder without mention of hyperactivity   . Other and unspecified mycoses   . Pharyngitis   . Acute nonsuppurative otitis media, unspecified   . No pertinent past medical history   . H/O varicella   . AMA (advanced maternal age) multigravida 35+   . Recurrent UTI (urinary tract infection)   . Hypothyroid     Past Surgical History  Procedure Laterality Date  . Cesarean section    . Cesarean section    . Knee arthroscopy    . Thyroidectomy  03/30/2014    Family History  Problem Relation Age of Onset  . Heart disease Father 7254    MI  . Stroke Maternal Grandmother   . Heart attack Maternal Grandmother   . Heart disease Maternal Grandmother   . Asthma Mother   . Other Neg Hx   . Hypertension Mother   . COPD Mother   . Anxiety disorder Mother   . Heart disease Father   . Cancer Maternal Grandmother     History   Social History  . Marital Status: Married    Spouse Name: N/A  . Number of Children: N/A  . Years of Education: N/A   Occupational History  . Not on file.   Social History Main Topics  . Smoking status: Former Games developermoker  . Smokeless tobacco: Never Used  . Alcohol Use:  0.0 oz/week    0 Standard drinks or equivalent per week     Comment: Rarely  . Drug Use: No  . Sexual Activity:    Partners: Male    Birth Control/ Protection: None   Other Topics Concern  . Not on file   Social History Narrative   ** Merged History Encounter **         Current outpatient prescriptions:  .  levothyroxine (SYNTHROID, LEVOTHROID) 25 MCG tablet, Take 25 mcg by mouth daily., Disp: , Rfl: 2 .  Multiple Vitamins tablet, Take 1 tablet by mouth daily., Disp: , Rfl:   Allergies  Allergen Reactions  . Sulfa Antibiotics Hives  . Sulfa Antibiotics      Review of Systems  Constitutional: Negative for fever, chills, weight loss and malaise/fatigue.  HENT: Negative for sore throat.       Objective  Filed Vitals:   03/30/15 1041  BP: 100/70  Pulse: 83  Temp: 98.1 F (36.7 C)  TempSrc: Oral  Resp: 17  Height: 5\' 3"  (1.6 m)  Weight: 141 lb 3.2 oz (64.048 kg)  SpO2: 96%    Physical Exam  Constitutional: She is well-developed, well-nourished, and in  no distress.  HENT:  Right Ear: Hearing, tympanic membrane, external ear and ear canal normal.  Left Ear: Hearing, tympanic membrane, external ear and ear canal normal.  Mouth/Throat: No oropharyngeal exudate, posterior oropharyngeal edema or posterior oropharyngeal erythema.  Neck: Normal range of motion. No thyroid mass present.  Cardiovascular: Normal rate and regular rhythm.   Pulmonary/Chest: Effort normal and breath sounds normal.  Lymphadenopathy:       Head (left side): Occipital adenopathy present.       Right cervical: No superficial cervical and no deep cervical adenopathy present.      Left cervical: No superficial cervical and no deep cervical adenopathy present.       Right axillary: No lateral adenopathy present.       Left axillary: No lateral adenopathy present. Skin:  Palpable, tender, nonerythematous, non-draining mass over the base of the fourth digit, resembling an infected follicle.   Nursing note and vitals reviewed.     Assessment & Plan 1. Palpable lymph node Check CBC today and follow up in 2 weeks. If LN still palpable after 2 weeks, then will order an Ultrasound for evaluation. - CBC with Differential  2. Mass of skin of left foot Likely an infected follicle. Start on Abx therapy and follow up in 2 weeks. - cephALEXin (KEFLEX) 500 MG capsule; Take 1 capsule (500 mg total) by mouth 2 (two) times daily.  Dispense: 14 capsule; Refill: 0    Baby Stairs Asad A[facility] 03/30/2015 10:53 AM

## 2015-03-31 LAB — CBC WITH DIFFERENTIAL/PLATELET
BASOS: 0 %
Basophils Absolute: 0 10*3/uL (ref 0.0–0.2)
EOS (ABSOLUTE): 0.1 10*3/uL (ref 0.0–0.4)
EOS: 2 %
HEMOGLOBIN: 12.6 g/dL (ref 11.1–15.9)
Hematocrit: 38.7 % (ref 34.0–46.6)
IMMATURE GRANS (ABS): 0 10*3/uL (ref 0.0–0.1)
IMMATURE GRANULOCYTES: 0 %
LYMPHS: 25 %
Lymphocytes Absolute: 1.6 10*3/uL (ref 0.7–3.1)
MCH: 29 pg (ref 26.6–33.0)
MCHC: 32.6 g/dL (ref 31.5–35.7)
MCV: 89 fL (ref 79–97)
MONOCYTES: 5 %
Monocytes Absolute: 0.3 10*3/uL (ref 0.1–0.9)
Neutrophils Absolute: 4.5 10*3/uL (ref 1.4–7.0)
Neutrophils: 68 %
PLATELETS: 245 10*3/uL (ref 150–379)
RBC: 4.35 x10E6/uL (ref 3.77–5.28)
RDW: 13.4 % (ref 12.3–15.4)
WBC: 6.5 10*3/uL (ref 3.4–10.8)

## 2015-04-07 ENCOUNTER — Encounter: Payer: Self-pay | Admitting: Family Medicine

## 2015-04-07 ENCOUNTER — Ambulatory Visit (INDEPENDENT_AMBULATORY_CARE_PROVIDER_SITE_OTHER): Payer: BLUE CROSS/BLUE SHIELD | Admitting: Family Medicine

## 2015-04-07 VITALS — BP 103/68 | HR 110 | Temp 98.9°F | Resp 18 | Ht 63.0 in | Wt 139.4 lb

## 2015-04-07 DIAGNOSIS — R05 Cough: Secondary | ICD-10-CM | POA: Insufficient documentation

## 2015-04-07 DIAGNOSIS — R058 Other specified cough: Secondary | ICD-10-CM

## 2015-04-07 DIAGNOSIS — J343 Hypertrophy of nasal turbinates: Secondary | ICD-10-CM | POA: Diagnosis not present

## 2015-04-07 MED ORDER — GUAIFENESIN-CODEINE 100-10 MG/5ML PO SYRP: 10.0000 mL | ORAL_SOLUTION | Freq: Three times a day (TID) | ORAL | Status: DC | PRN

## 2015-04-07 MED ORDER — FLUTICASONE PROPIONATE 50 MCG/ACT NA SUSP: 2.0000 | Freq: Every day | NASAL | Status: DC

## 2015-04-07 NOTE — Progress Notes (Signed)
Name: Cynthia Baxter   MRN: [id number]    DOB: 01-09-1976   Date:04/07/2015       Progress Note  Subjective  Chief Complaint  Chief Complaint  Patient presents with  . Acute Visit    Cold;congestion    Cough This is a new problem. The current episode started yesterday. The cough is non-productive. Associated symptoms include chest pain, chills, a sore throat, shortness of breath and sweats. Pertinent negatives include no ear congestion, ear pain, fever or nasal congestion. She has tried rest for the symptoms. The treatment provided mild relief. There is no history of asthma or COPD.      Past Medical History  Diagnosis Date  . Attention deficit disorder without mention of hyperactivity   . Acute sinusitis, unspecified   . Attention deficit disorder without mention of hyperactivity   . Other and unspecified mycoses   . Pharyngitis   . Acute nonsuppurative otitis media, unspecified   . No pertinent past medical history   . H/O varicella   . AMA (advanced maternal age) multigravida 35+   . Recurrent UTI (urinary tract infection)   . Hypothyroid     Past Surgical History  Procedure Laterality Date  . Cesarean section    . Cesarean section    . Knee arthroscopy    . Thyroidectomy  03/30/2014    Family History  Problem Relation Age of Onset  . Heart disease Father 65    MI  . Stroke Maternal Grandmother   . Heart attack Maternal Grandmother   . Heart disease Maternal Grandmother   . Asthma Mother   . Other Neg Hx   . Hypertension Mother   . COPD Mother   . Anxiety disorder Mother   . Heart disease Father   . Cancer Maternal Grandmother     History   Social History  . Marital Status: Married    Spouse Name: N/A  . Number of Children: N/A  . Years of Education: N/A   Occupational History  . Not on file.   Social History Main Topics  . Smoking status: Former Games developer  . Smokeless tobacco: Never Used  . Alcohol Use: 0.0 oz/week    0 Standard drinks or  equivalent per week     Comment: Rarely  . Drug Use: No  . Sexual Activity:    Partners: Male    Birth Control/ Protection: None   Other Topics Concern  . Not on file   Social History Narrative   ** Merged History Encounter **         Current outpatient prescriptions:  .  cephALEXin (KEFLEX) 500 MG capsule, Take 1 capsule (500 mg total) by mouth 2 (two) times daily., Disp: 14 capsule, Rfl: 0 .  levothyroxine (SYNTHROID, LEVOTHROID) 25 MCG tablet, Take 25 mcg by mouth daily., Disp: , Rfl: 2 .  Multiple Vitamins tablet, Take 1 tablet by mouth daily., Disp: , Rfl:   Allergies  Allergen Reactions  . Sulfa Antibiotics Hives  . Sulfa Antibiotics      Review of Systems  Constitutional: Positive for chills. Negative for fever.  HENT: Positive for sore throat. Negative for ear pain.   Respiratory: Positive for cough and shortness of breath.   Cardiovascular: Positive for chest pain.      Objective  Filed Vitals:   04/07/15 1450  BP: 103/68  Pulse: 110  Temp: 98.9 F (37.2 C)  TempSrc: Oral  Resp: 18  Height: 5\' 3"  (1.6 m)  Weight:  139 lb 6.4 oz (63.231 kg)  SpO2: 98%    Physical Exam  Constitutional: She is well-developed, well-nourished, and in no distress.  HENT:  Head: Normocephalic and atraumatic.  Right Ear: Hearing and tympanic membrane normal.  Left Ear: Hearing and tympanic membrane normal.  Nose: Mucosal edema and rhinorrhea present.  Mouth/Throat: Posterior oropharyngeal erythema present.  Eyes: Pupils are equal, round, and reactive to light.  Cardiovascular: Regular rhythm.  Tachycardia present.   Pulmonary/Chest: Effort normal and breath sounds normal.  Nursing note and vitals reviewed.   Assessment & Plan 1. Dry cough Recommended antitussive for coughing. If her symptoms do not improve within 3-4 days, she will call in for an antibiotic. - guaiFENesin-codeine (ROBITUSSIN AC) 100-10 MG/5ML syrup; Take 10 mLs by mouth 3 (three) times daily as  needed for cough.  Dispense: 150 mL; Refill: 0  2. Nasal turbinate hypertrophy Recommended Flonase to be used for 5 days. - fluticasone (FLONASE) 50 MCG/ACT nasal spray; Place 2 sprays into both nostrils daily.  Dispense: 16 g; Refill: 0    Manish Ruggiero Asad [facility] 04/07/2015 3:05 PM

## 2015-04-08 ENCOUNTER — Telehealth: Payer: Self-pay | Admitting: Family Medicine

## 2015-04-08 DIAGNOSIS — B379 Candidiasis, unspecified: Secondary | ICD-10-CM | POA: Insufficient documentation

## 2015-04-08 DIAGNOSIS — T3695XA Adverse effect of unspecified systemic antibiotic, initial encounter: Principal | ICD-10-CM

## 2015-04-08 MED ORDER — FLUCONAZOLE 150 MG PO TABS: 150.0000 mg | ORAL_TABLET | Freq: Once | ORAL | Status: DC

## 2015-04-08 NOTE — Telephone Encounter (Signed)
Prescription for Diflucan 150 mg one time dose is sent to patient's pharmacy

## 2015-04-08 NOTE — Telephone Encounter (Signed)
Pt states she has a yeast infection from the antibiotics that was given to her. She is asking for something to be called into [facility] in [place].

## 2015-04-08 NOTE — Telephone Encounter (Signed)
Routed to Dr. Shah °

## 2015-04-11 ENCOUNTER — Telehealth: Payer: Self-pay | Admitting: Family Medicine

## 2015-04-11 NOTE — Telephone Encounter (Signed)
Dr. Sherryll Burger has sent medication for patient to [facility] [address]

## 2015-04-11 NOTE — Telephone Encounter (Signed)
Pt stated that she was seen last week and she is still not feeling better.  Would like something called in if possible.  Please call patient back as to what will be done.

## 2015-04-12 NOTE — Telephone Encounter (Signed)
Spoke with patient and she states that medication guaifenesin -Codeine 100-10mg /77ml syrup, patient states she took syrup for 2 days and feels as if her cough has not stopped and is having bad chest pain she thinks may be having a reaction to the codeine.

## 2015-04-13 NOTE — Telephone Encounter (Signed)
Spoke with patient and Dr. Sherryll Burger is going to call her back

## 2015-04-13 NOTE — Telephone Encounter (Signed)
Called to discuss her symptoms and left a voice message for patient to return our call

## 2015-04-14 ENCOUNTER — Ambulatory Visit: Payer: BLUE CROSS/BLUE SHIELD | Admitting: Family Medicine

## 2015-04-20 ENCOUNTER — Ambulatory Visit: Payer: BLUE CROSS/BLUE SHIELD | Admitting: Family Medicine

## 2015-07-22 ENCOUNTER — Ambulatory Visit: Payer: BLUE CROSS/BLUE SHIELD | Admitting: Family Medicine

## 2015-07-25 ENCOUNTER — Ambulatory Visit: Payer: BLUE CROSS/BLUE SHIELD | Admitting: Family Medicine

## 2016-01-17 ENCOUNTER — Other Ambulatory Visit: Payer: Self-pay | Admitting: Obstetrics and Gynecology

## 2016-01-17 DIAGNOSIS — Z1231 Encounter for screening mammogram for malignant neoplasm of breast: Secondary | ICD-10-CM

## 2016-01-25 ENCOUNTER — Ambulatory Visit: Payer: BLUE CROSS/BLUE SHIELD

## 2016-01-26 ENCOUNTER — Ambulatory Visit
Admission: RE | Admit: 2016-01-26 | Discharge: 2016-01-26 | Disposition: A | Discharge status: Home / Self Care | Payer: BLUE CROSS/BLUE SHIELD | Source: Ambulatory Visit | Attending: Obstetrics and Gynecology | Admitting: Obstetrics and Gynecology

## 2016-01-26 DIAGNOSIS — Z1231 Encounter for screening mammogram for malignant neoplasm of breast: Secondary | ICD-10-CM | POA: Diagnosis not present

## 2016-01-30 ENCOUNTER — Ambulatory Visit (INDEPENDENT_AMBULATORY_CARE_PROVIDER_SITE_OTHER): Payer: BLUE CROSS/BLUE SHIELD | Admitting: Podiatry

## 2016-01-30 ENCOUNTER — Encounter: Payer: Self-pay | Admitting: Podiatry

## 2016-01-30 VITALS — BP 105/76 | HR 65 | Resp 12

## 2016-01-30 DIAGNOSIS — H539 Unspecified visual disturbance: Secondary | ICD-10-CM | POA: Insufficient documentation

## 2016-01-30 DIAGNOSIS — B070 Plantar wart: Secondary | ICD-10-CM

## 2016-01-30 DIAGNOSIS — J0100 Acute maxillary sinusitis, unspecified: Secondary | ICD-10-CM | POA: Insufficient documentation

## 2016-01-30 DIAGNOSIS — R05 Cough: Secondary | ICD-10-CM | POA: Insufficient documentation

## 2016-01-30 DIAGNOSIS — F439 Reaction to severe stress, unspecified: Secondary | ICD-10-CM | POA: Insufficient documentation

## 2016-01-30 DIAGNOSIS — R059 Cough, unspecified: Secondary | ICD-10-CM | POA: Insufficient documentation

## 2016-01-30 DIAGNOSIS — R221 Localized swelling, mass and lump, neck: Secondary | ICD-10-CM | POA: Insufficient documentation

## 2016-01-30 DIAGNOSIS — J019 Acute sinusitis, unspecified: Secondary | ICD-10-CM | POA: Insufficient documentation

## 2016-01-30 DIAGNOSIS — E890 Postprocedural hypothyroidism: Secondary | ICD-10-CM | POA: Insufficient documentation

## 2016-01-30 DIAGNOSIS — E039 Hypothyroidism, unspecified: Secondary | ICD-10-CM | POA: Insufficient documentation

## 2016-01-30 DIAGNOSIS — E041 Nontoxic single thyroid nodule: Secondary | ICD-10-CM | POA: Insufficient documentation

## 2016-01-30 DIAGNOSIS — Z134 Encounter for screening for unspecified developmental delays: Secondary | ICD-10-CM | POA: Insufficient documentation

## 2016-01-30 DIAGNOSIS — B079 Viral wart, unspecified: Secondary | ICD-10-CM

## 2016-01-30 DIAGNOSIS — J029 Acute pharyngitis, unspecified: Secondary | ICD-10-CM | POA: Insufficient documentation

## 2016-01-30 DIAGNOSIS — M25569 Pain in unspecified knee: Secondary | ICD-10-CM | POA: Insufficient documentation

## 2016-01-30 DIAGNOSIS — I7300 Raynaud's syndrome without gangrene: Secondary | ICD-10-CM | POA: Insufficient documentation

## 2016-01-30 DIAGNOSIS — I498 Other specified cardiac arrhythmias: Secondary | ICD-10-CM | POA: Insufficient documentation

## 2016-01-30 DIAGNOSIS — H65199 Other acute nonsuppurative otitis media, unspecified ear: Secondary | ICD-10-CM | POA: Insufficient documentation

## 2016-01-30 DIAGNOSIS — R0982 Postnasal drip: Secondary | ICD-10-CM | POA: Insufficient documentation

## 2016-01-30 DIAGNOSIS — R0781 Pleurodynia: Secondary | ICD-10-CM | POA: Insufficient documentation

## 2016-01-30 MED ORDER — FLUOROURACIL 5 % EX CREA: TOPICAL_CREAM | Freq: Two times a day (BID) | CUTANEOUS | Status: DC

## 2016-01-30 NOTE — Progress Notes (Signed)
She presents today with a chief complaint of I think I have warts. She states that she had warts many years ago and had it removed and she was doing good for quite some time she's tried over-the-counter freeze preparation preparations but nothing seems to work.  Objective: Vital signs are stable she is alert and oriented 3. I have reviewed her past metal history medications allergy surgery social history and review of systems. Pulses remain palpable bilateral. Neurologic sensorium is intact bilateral. Deep tendon reflexes are intact bilateral and muscle strength is 5 over 5 dorsiflexion plantar flexors and inverters everters all intrinsic musculature is intact. Orthopedic evaluation demonstrates all joints distal to the ankle for range of motion without crepitation. She has a verrucoid lesion to the distal lateral aspect of the hallux right as well as the lateral aspect plantar lateral aspect of the fourth digit left foot.  Assessment: Verruca plantaris 2,1 right foot fourth left foot.  Plan: I applied chemical to the area today to be left on for the next day and then washed out thoroughly I also wrote a prescription for Efudex cream she will apply this twice daily under occlusion and I will follow-up with her in 6 weeks.

## 2016-02-02 ENCOUNTER — Telehealth: Payer: Self-pay | Admitting: Cardiovascular Disease

## 2016-02-02 NOTE — Telephone Encounter (Signed)
Pt would like to continue to see Dr. Elease Hash[place]Nahser in the [place] office

## 2016-02-02 NOTE — Telephone Encounter (Signed)
LMTCB

## 2016-02-02 NOTE — Telephone Encounter (Signed)
Pt aware she is scheduled to see Dr Elease HashimotoNahser 02/03/16 9:15AM [facility]. Pt states she is not having any symptoms at this time.

## 2016-02-02 NOTE — Telephone Encounter (Signed)
Pt states she has been experiencing a lot of "fluttering", states it feels "harder, like it is thumping". It will stop for a couple hours and then start again. Denies any CP, SOB, states she has felt nauseated the past couple days, but not currently feeling that way. Please call.

## 2016-02-02 NOTE — Telephone Encounter (Signed)
[place] is [place] [facility].

## 2016-02-02 NOTE — Telephone Encounter (Signed)
This patient has been scheduled to see Dr Elease HashimotoNahser tomorrow at 9:15AM.

## 2016-02-02 NOTE — Telephone Encounter (Signed)
Follow up   Pt is calling back she said she is unsure why RN   called her because her has appt scheduled for yearly follow up appt   She said that she did call [place] office before her appt was scheduled

## 2016-02-03 ENCOUNTER — Encounter: Payer: Self-pay | Admitting: Cardiovascular Disease

## 2016-02-03 ENCOUNTER — Ambulatory Visit (INDEPENDENT_AMBULATORY_CARE_PROVIDER_SITE_OTHER): Payer: BLUE CROSS/BLUE SHIELD | Admitting: Cardiovascular Disease

## 2016-02-03 VITALS — BP 96/78 | HR 70 | Ht 63.0 in | Wt 145.8 lb

## 2016-02-03 DIAGNOSIS — R002 Palpitations: Secondary | ICD-10-CM | POA: Diagnosis not present

## 2016-02-03 NOTE — Progress Notes (Signed)
Cardiology Office Note   Date:  02/03/2016   ID:  Cynthia Baxter, DOB February 26, 1976, MRN [id number]  PCP:  Brayton El, MD  Cardiologist:   Kristeen Miss, MD   Chief Complaint  Patient presents with  . Follow-up   1. Sinus tachycardia 2 ADD 3. hypothyroidism   History of Present Illness: Cynthia Baxter is a 40 y.o. female who presents for follow-up of her episodes of tachycardia. She stopped her adderall and here tachycardia has resolved. She has been diagnosed with hypothyroidism and has gained some weight. Is now exercising.   Feb 03, 2016:  Was having some fatigue.   Was started on Vit. B12. Switched to armor thyroid  Is exercising .   Yesterday , she had a mid sternal sensation that she could feel her heart beating.   Off and on all day .   Were not single isolated heart beats  Not related to menstral cycle.   Works as a Theatre manager.    Past Medical History  Diagnosis Date  . Attention deficit disorder without mention of hyperactivity   . Acute sinusitis, unspecified   . Attention deficit disorder without mention of hyperactivity   . Other and unspecified mycoses   . Pharyngitis   . Acute nonsuppurative otitis media, unspecified   . No pertinent past medical history   . H/O varicella   . AMA (advanced maternal age) multigravida 35+   . Recurrent UTI (urinary tract infection)   . Hypothyroid     Past Surgical History  Procedure Laterality Date  . Cesarean section    . Cesarean section    . Knee arthroscopy    . Thyroidectomy  03/30/2014     Current Outpatient Prescriptions  Medication Sig Dispense Refill  . cyanocobalamin (,VITAMIN B-12,) 1000 MCG/ML injection Inject 1,000 mcg into the muscle every 30 (thirty) days.     . fluorouracil (EFUDEX) 5 % cream Apply topically 2 (two) times daily. 40 g 0  . fluticasone (FLONASE) 50 MCG/ACT nasal spray Place 2 sprays into both nostrils daily. 16 g 0  . Multiple Vitamins tablet Take 1  tablet by mouth daily.    Marland Kitchen thyroid (ARMOUR) 15 MG tablet Take 15 mg by mouth daily.      No current facility-administered medications for this visit.    Allergies:   Sulfa antibiotics and Sulfa antibiotics    Social History:  The patient  reports that she has quit smoking. She has never used smokeless tobacco. She reports that she drinks alcohol. She reports that she does not use illicit drugs.   Family History:  The patient's family history includes Anxiety disorder in her mother; Asthma in her mother; Breast cancer (age of onset: 22) in her maternal grandmother; COPD in her mother; Cancer in her maternal grandmother; Heart attack in her maternal grandmother; Heart disease in her maternal grandmother; Heart disease (age of onset: 50) in her father; Hypertension in her mother; Stroke in her maternal grandmother. There is no history of Other.    ROS:  Please see the history of present illness.    Review of Systems: Constitutional:  denies fever, chills, diaphoresis, appetite change and fatigue.  HEENT: denies photophobia, eye pain, redness, hearing loss, ear pain, congestion, sore throat, rhinorrhea, sneezing, neck pain, neck stiffness and tinnitus.  Respiratory: denies SOB, DOE, cough, chest tightness, and wheezing.  Cardiovascular: denies chest pain, palpitations and leg swelling.  Gastrointestinal: denies nausea, vomiting, abdominal pain, diarrhea, constipation, blood in stool.  Genitourinary: denies dysuria, urgency, frequency, hematuria, flank pain and difficulty urinating.  Musculoskeletal: denies  myalgias, back pain, joint swelling, arthralgias and gait problem.   Skin: denies pallor, rash and wound.  Neurological: denies dizziness, seizures, syncope, weakness, light-headedness, numbness and headaches.   Hematological: denies adenopathy, easy bruising, personal or family bleeding history.  Psychiatric/ Behavioral: denies suicidal ideation, mood changes, confusion, nervousness,  sleep disturbance and agitation.       All other systems are reviewed and negative.    PHYSICAL EXAM: VS:  BP 96/78 mmHg  Pulse 70  Ht 5\' 3"  (1.6 m)  Wt 145 lb 12.8 oz (66.134 kg)  BMI 25.83 kg/m2  LMP 01/25/2016 , BMI Body mass index is 25.83 kg/(m^2). GEN: Well nourished, well developed, in no acute distress HEENT: normal Neck: no JVD, carotid bruits, or masses Cardiac: RRR;  Soft systolic murmur,  No  rubs, or gallops,no edema  Respiratory:  clear to auscultation bilaterally, normal work of breathing GI: soft, nontender, nondistended, + BS MS: no deformity or atrophy Skin: warm and dry, no rash Neuro:  Strength and sensation are intact Psych: normal  EKG:  EKG is not ordered today.   Recent Labs: 03/30/2015: Platelets 245    Lipid Panel    Component Value Date/Time   CHOL 174 10/05/2013 1117   TRIG 89 10/05/2013 1117   HDL 59 10/05/2013 1117   CHOLHDL 2.9 10/05/2013 1117   LDLCALC 97 10/05/2013 1117      Wt Readings from Last 3 Encounters:  02/03/16 145 lb 12.8 oz (66.134 kg)  04/07/15 139 lb 6.4 oz (63.231 kg)  03/30/15 141 lb 3.2 oz (64.048 kg)      Other studies Reviewed: Additional studies/ records that were reviewed today include: . Review of the above records demonstrates:    ASSESSMENT AND PLAN:  1.  Sinus tachycardia:  Her sinus tachycardia has resolved when she stopped taking the Adderall.   2. Soft systolic  murmur:    Has mild MR and TR   3.  Palpitations :  She presents today for palpitations that have been occurring for the past several weeks.  Worse yesterday  None today . Will place a 30 day event monitor  Current medicines are reviewed at length with the patient today.  The patient does not have concerns regarding medicines.  The following changes have been made:  no change  Disposition:   FU with me in 1 year.    Signed, Kristeen MissPhilip Nahser, MD  02/03/2016 9:27 AM    [facility] [address],  [place], [place]  [address] Phone: [phone]([phone]; Fax: [phone]

## 2016-02-03 NOTE — Patient Instructions (Signed)
Medication Instructions:  Your physician recommends that you continue on your current medications as directed. Please refer to the Current Medication list given to you today.   Labwork: None Ordered   Testing/Procedures: Your physician has recommended that you wear an event monitor. Event monitors are medical devices that record the heart's electrical activity. Doctors most often us these monitors to diagnose arrhythmias. Arrhythmias are problems with the speed or rhythm of the heartbeat. The monitor is a small, portable device. You can wear one while you do your normal daily activities. This is usually used to diagnose what is causing palpitations/syncope (passing out).   Follow-Up: Your physician recommends that you schedule a follow-up appointment in: 3 months with Dr. Nahser   If you need a refill on your cardiac medications before your next appointment, please call your pharmacy.   Thank you for choosing [facility]! Anishka Bushard, RN [phone]    

## 2016-02-07 ENCOUNTER — Ambulatory Visit (INDEPENDENT_AMBULATORY_CARE_PROVIDER_SITE_OTHER): Payer: BLUE CROSS/BLUE SHIELD

## 2016-02-07 DIAGNOSIS — R002 Palpitations: Secondary | ICD-10-CM | POA: Diagnosis not present

## 2016-02-29 ENCOUNTER — Ambulatory Visit: Payer: Self-pay | Admitting: Podiatry

## 2016-03-14 ENCOUNTER — Ambulatory Visit (INDEPENDENT_AMBULATORY_CARE_PROVIDER_SITE_OTHER): Payer: BLUE CROSS/BLUE SHIELD | Admitting: Podiatry

## 2016-03-14 ENCOUNTER — Encounter: Payer: Self-pay | Admitting: Podiatry

## 2016-03-14 DIAGNOSIS — B070 Plantar wart: Secondary | ICD-10-CM

## 2016-03-14 DIAGNOSIS — B079 Viral wart, unspecified: Secondary | ICD-10-CM

## 2016-03-14 NOTE — Progress Notes (Signed)
She presents today for follow-up of warts to the hallux right and between the toes of the fourth and fifth toes left foot. She continues the Efudex cream.  Objective: Vital signs are stable alert and oriented 3 these are much smaller than they have been previously. The warts appear to be much more superficial.  Assessment: Warts or for right foot left.  Plan: Cervical distraction lesions bilateral continue the use of Efudex. Follow up with her in 6 weeks if necessary.

## 2016-03-20 ENCOUNTER — Ambulatory Visit: Payer: BLUE CROSS/BLUE SHIELD | Admitting: Family Medicine

## 2016-04-02 ENCOUNTER — Ambulatory Visit: Payer: BLUE CROSS/BLUE SHIELD | Admitting: Family Medicine

## 2016-04-25 ENCOUNTER — Ambulatory Visit: Payer: BLUE CROSS/BLUE SHIELD | Admitting: Podiatry

## 2016-04-26 ENCOUNTER — Encounter: Payer: Self-pay | Admitting: Cardiovascular Disease

## 2016-05-02 ENCOUNTER — Ambulatory Visit: Payer: BLUE CROSS/BLUE SHIELD | Admitting: Podiatry

## 2016-05-10 ENCOUNTER — Ambulatory Visit: Payer: Self-pay | Admitting: Cardiovascular Disease

## 2016-06-25 ENCOUNTER — Telehealth: Payer: Self-pay | Admitting: Family Medicine

## 2016-06-25 NOTE — Telephone Encounter (Signed)
Pt called stating that her phone fell out of her purse into the port a potty. Pt wants to know if she is due for a tetanus or if you think she needs labs. Pt is not having any symptoms of anything. Please return her call.

## 2016-06-26 NOTE — Telephone Encounter (Signed)
Returned patient call and informed her that she is up to date on Tetanus immunization .

## 2016-07-03 ENCOUNTER — Encounter: Payer: Self-pay | Admitting: Cardiovascular Disease

## 2016-07-03 ENCOUNTER — Ambulatory Visit (INDEPENDENT_AMBULATORY_CARE_PROVIDER_SITE_OTHER): Payer: BLUE CROSS/BLUE SHIELD | Admitting: Cardiovascular Disease

## 2016-07-03 VITALS — BP 112/64 | HR 81 | Ht 62.0 in | Wt 145.0 lb

## 2016-07-03 DIAGNOSIS — R002 Palpitations: Secondary | ICD-10-CM

## 2016-07-03 NOTE — Patient Instructions (Signed)

## 2016-07-03 NOTE — Progress Notes (Signed)
Cardiology Office Note   Date:  07/03/2016   ID:  Cynthia Baxter, DOB 01/31/1976, MRN [id number]  PCP:  Brayton ElSyed Shah, MD  Cardiologist:   Kristeen MissPhilip Nahser, MD   Chief Complaint  Patient presents with  . office visit f/u    palpitations   1. Sinus tachycardia 2 ADD 3. hypothyroidism   Previous notes:  Cynthia Baxter is a 40 y.o. female who presents for follow-up of her episodes of tachycardia. She stopped her adderall and here tachycardia has resolved. She has been diagnosed with hypothyroidism and has gained some weight. Is now exercising.   Feb 03, 2016:  Was having some fatigue.   Was started on Vit. B12. Switched to armor thyroid  Is exercising .   Yesterday , she had a mid sternal sensation that she could feel her heart beating.   Off and on all day .   Were not single isolated heart beats  Not related to menstral cycle.   Works as a Theatre managermental health counselor.   Oct. 24, 2017:  Cynthia Baxter is doing well.  Has rare episodes of chest pressure Also has some palpitations  Has slowed her exercise some.   Work is going well.    Works as a Theatre managermental health counselor.   - in the anxiety and trauma area .   Past Medical History:  Diagnosis Date  . Acute nonsuppurative otitis media, unspecified   . Acute sinusitis, unspecified   . AMA (advanced maternal age) multigravida 35+   . Attention deficit disorder without mention of hyperactivity   . Attention deficit disorder without mention of hyperactivity   . H/O varicella   . Hypothyroid   . No pertinent past medical history   . Other and unspecified mycoses   . Pharyngitis   . Recurrent UTI (urinary tract infection)     Past Surgical History:  Procedure Laterality Date  . CESAREAN SECTION    . CESAREAN SECTION    . KNEE ARTHROSCOPY    . THYROIDECTOMY  03/30/2014     Current Outpatient Prescriptions  Medication Sig Dispense Refill  . cyanocobalamin (,VITAMIN B-12,) 1000 MCG/ML injection Inject 1,000 mcg into the  muscle every 30 (thirty) days.     . fluticasone (FLONASE) 50 MCG/ACT nasal spray Place 2 sprays into both nostrils daily. 16 g 0  . Multiple Vitamins tablet Take 1 tablet by mouth daily.    Marland Kitchen. thyroid (ARMOUR) 15 MG tablet Take 15 mg by mouth daily.      No current facility-administered medications for this visit.     Allergies:   Sulfa antibiotics    Social History:  The patient  reports that she has quit smoking. She has never used smokeless tobacco. She reports that she drinks alcohol. She reports that she does not use drugs.   Family History:  The patient's family history includes Anxiety disorder in her mother; Asthma in her mother; Breast cancer (age of onset: 1460) in her maternal grandmother; COPD in her mother; Cancer in her maternal grandmother; Heart attack in her maternal grandmother; Heart disease in her maternal grandmother; Heart disease (age of onset: 1954) in her father; Hypertension in her mother; Stroke in her maternal grandmother.    ROS:  Please see the history of present illness.    Review of Systems: Constitutional:  denies fever, chills, diaphoresis, appetite change and fatigue.  HEENT: denies photophobia, eye pain, redness, hearing loss, ear pain, congestion, sore throat, rhinorrhea, sneezing, neck pain, neck stiffness and tinnitus.  Respiratory: denies SOB, DOE, cough, chest tightness, and wheezing.  Cardiovascular: denies chest pain, palpitations and leg swelling.  Gastrointestinal: denies nausea, vomiting, abdominal pain, diarrhea, constipation, blood in stool.  Genitourinary: denies dysuria, urgency, frequency, hematuria, flank pain and difficulty urinating.  Musculoskeletal: denies  myalgias, back pain, joint swelling, arthralgias and gait problem.   Skin: denies pallor, rash and wound.  Neurological: denies dizziness, seizures, syncope, weakness, light-headedness, numbness and headaches.   Hematological: denies adenopathy, easy bruising, personal or family  bleeding history.  Psychiatric/ Behavioral: denies suicidal ideation, mood changes, confusion, nervousness, sleep disturbance and agitation.       All other systems are reviewed and negative.    PHYSICAL EXAM: VS:  BP 112/64   Pulse 81   Ht 5\' 2"  (1.575 m)   Wt 145 lb (65.8 kg)   SpO2 99%   BMI 26.52 kg/m  , BMI Body mass index is 26.52 kg/m. GEN: Well nourished, well developed, in no acute distress  HEENT: normal  Neck: no JVD, carotid bruits, or masses Cardiac: RRR;  Soft systolic murmur,  No  rubs, or gallops,no edema  Respiratory:  clear to auscultation bilaterally, normal work of breathing GI: soft, nontender, nondistended, + BS MS: no deformity or atrophy  Skin: warm and dry, no rash Neuro:  Strength and sensation are intact Psych: normal  EKG:  EKG is not ordered today.   Recent Labs: No results found for requested labs within last 8760 hours.    Lipid Panel    Component Value Date/Time   CHOL 174 10/05/2013 1117   TRIG 89 10/05/2013 1117   HDL 59 10/05/2013 1117   CHOLHDL 2.9 10/05/2013 1117   LDLCALC 97 10/05/2013 1117      Wt Readings from Last 3 Encounters:  07/03/16 145 lb (65.8 kg)  02/03/16 145 lb 12.8 oz (66.1 kg)  04/07/15 139 lb 6.4 oz (63.2 kg)     ECG: 07/03/2016, total sinus rhythm at 83. Very minimal nonspecific ST abn.   - likely benign  Other studies Reviewed: Additional studies/ records that were reviewed today include: . Review of the above records demonstrates:    ASSESSMENT AND PLAN:  1.  Sinus tachycardia:  Her sinus tachycardia has resolved when she stopped taking the Adderall.  She seems to be doing fairly well. Continue current medical management.  2. Soft systolic  murmur:    Has mild MR and TR   3.  Palpitations :  She presents today for palpitations that have been occurring for the past several weeks.  Worse yesterday  None today . Her 30 day event monitor did not reveal any significant irregularities. We discussed  the Kardia monitor by [employer].   Current medicines are reviewed at length with the patient today.  The patient does not have concerns regarding medicines.  The following changes have been made:  no change  Disposition:   FU with me in 1 year.    Signed, Kristeen Miss, MD  07/03/2016 12:14 PM    [facility] [address] [place], [place], [place]  [address] Phone: [phone]; Fax: [phone]

## 2016-10-17 DIAGNOSIS — J988 Other specified respiratory disorders: Secondary | ICD-10-CM | POA: Diagnosis not present

## 2016-10-17 DIAGNOSIS — Z20828 Contact with and (suspected) exposure to other viral communicable diseases: Secondary | ICD-10-CM | POA: Diagnosis not present

## 2016-10-17 DIAGNOSIS — M791 Myalgia: Secondary | ICD-10-CM | POA: Diagnosis not present

## 2016-10-18 DIAGNOSIS — J069 Acute upper respiratory infection, unspecified: Secondary | ICD-10-CM | POA: Diagnosis not present

## 2016-10-23 DIAGNOSIS — R05 Cough: Secondary | ICD-10-CM | POA: Diagnosis not present

## 2016-10-27 DIAGNOSIS — B029 Zoster without complications: Secondary | ICD-10-CM | POA: Diagnosis not present

## 2016-12-04 DIAGNOSIS — F4312 Post-traumatic stress disorder, chronic: Secondary | ICD-10-CM | POA: Diagnosis not present

## 2016-12-04 DIAGNOSIS — F411 Generalized anxiety disorder: Secondary | ICD-10-CM | POA: Diagnosis not present

## 2016-12-04 DIAGNOSIS — F902 Attention-deficit hyperactivity disorder, combined type: Secondary | ICD-10-CM | POA: Diagnosis not present

## 2016-12-31 DIAGNOSIS — F411 Generalized anxiety disorder: Secondary | ICD-10-CM | POA: Diagnosis not present

## 2016-12-31 DIAGNOSIS — F4312 Post-traumatic stress disorder, chronic: Secondary | ICD-10-CM | POA: Diagnosis not present

## 2016-12-31 DIAGNOSIS — F902 Attention-deficit hyperactivity disorder, combined type: Secondary | ICD-10-CM | POA: Diagnosis not present

## 2017-01-23 DIAGNOSIS — E890 Postprocedural hypothyroidism: Secondary | ICD-10-CM | POA: Diagnosis not present

## 2017-01-23 DIAGNOSIS — Z1231 Encounter for screening mammogram for malignant neoplasm of breast: Secondary | ICD-10-CM | POA: Diagnosis not present

## 2017-01-23 DIAGNOSIS — E538 Deficiency of other specified B group vitamins: Secondary | ICD-10-CM | POA: Diagnosis not present

## 2017-01-23 DIAGNOSIS — Z01419 Encounter for gynecological examination (general) (routine) without abnormal findings: Secondary | ICD-10-CM | POA: Diagnosis not present

## 2017-01-23 DIAGNOSIS — Z630 Problems in relationship with spouse or partner: Secondary | ICD-10-CM | POA: Diagnosis not present

## 2017-01-24 ENCOUNTER — Other Ambulatory Visit: Payer: Self-pay | Admitting: Obstetrics and Gynecology

## 2017-01-24 DIAGNOSIS — Z1231 Encounter for screening mammogram for malignant neoplasm of breast: Secondary | ICD-10-CM

## 2017-01-29 DIAGNOSIS — E890 Postprocedural hypothyroidism: Secondary | ICD-10-CM | POA: Diagnosis not present

## 2017-02-01 DIAGNOSIS — F902 Attention-deficit hyperactivity disorder, combined type: Secondary | ICD-10-CM | POA: Diagnosis not present

## 2017-02-01 DIAGNOSIS — F411 Generalized anxiety disorder: Secondary | ICD-10-CM | POA: Diagnosis not present

## 2017-02-01 DIAGNOSIS — F4312 Post-traumatic stress disorder, chronic: Secondary | ICD-10-CM | POA: Diagnosis not present

## 2017-02-13 ENCOUNTER — Ambulatory Visit: Payer: BLUE CROSS/BLUE SHIELD | Attending: Obstetrics and Gynecology

## 2017-03-12 DIAGNOSIS — F902 Attention-deficit hyperactivity disorder, combined type: Secondary | ICD-10-CM | POA: Diagnosis not present

## 2017-03-12 DIAGNOSIS — E890 Postprocedural hypothyroidism: Secondary | ICD-10-CM | POA: Diagnosis not present

## 2017-03-12 DIAGNOSIS — Z0000 Encounter for general adult medical examination without abnormal findings: Secondary | ICD-10-CM | POA: Diagnosis not present

## 2017-03-12 DIAGNOSIS — E538 Deficiency of other specified B group vitamins: Secondary | ICD-10-CM | POA: Diagnosis not present

## 2017-03-27 ENCOUNTER — Ambulatory Visit
Admission: RE | Admit: 2017-03-27 | Discharge: 2017-03-27 | Disposition: A | Discharge status: Home / Self Care | Payer: BLUE CROSS/BLUE SHIELD | Source: Ambulatory Visit | Attending: Obstetrics and Gynecology | Admitting: Obstetrics and Gynecology

## 2017-03-27 DIAGNOSIS — Z1231 Encounter for screening mammogram for malignant neoplasm of breast: Secondary | ICD-10-CM | POA: Diagnosis not present

## 2017-04-22 DIAGNOSIS — H5213 Myopia, bilateral: Secondary | ICD-10-CM | POA: Diagnosis not present

## 2017-06-06 DIAGNOSIS — L7211 Pilar cyst: Secondary | ICD-10-CM | POA: Diagnosis not present

## 2017-06-14 DIAGNOSIS — L7211 Pilar cyst: Secondary | ICD-10-CM | POA: Diagnosis not present

## 2017-07-04 ENCOUNTER — Encounter: Payer: Self-pay | Admitting: Cardiovascular Disease

## 2017-07-04 ENCOUNTER — Encounter (INDEPENDENT_AMBULATORY_CARE_PROVIDER_SITE_OTHER): Payer: Self-pay

## 2017-07-04 ENCOUNTER — Ambulatory Visit (INDEPENDENT_AMBULATORY_CARE_PROVIDER_SITE_OTHER): Payer: BLUE CROSS/BLUE SHIELD | Admitting: Cardiovascular Disease

## 2017-07-04 VITALS — BP 114/76 | HR 84 | Ht 62.0 in | Wt 140.0 lb

## 2017-07-04 DIAGNOSIS — I340 Nonrheumatic mitral (valve) insufficiency: Secondary | ICD-10-CM

## 2017-07-04 DIAGNOSIS — R000 Tachycardia, unspecified: Secondary | ICD-10-CM | POA: Diagnosis not present

## 2017-07-04 NOTE — Patient Instructions (Signed)
Medication Instructions:  Your physician recommends that you continue on your current medications as directed. Please refer to the Current Medication list given to you today.   Labwork: None Ordered   Testing/Procedures: None Ordered   Follow-Up: Your physician wants you to follow-up in: 1 year with Dr. Nahser.  You will receive a reminder letter in the mail two months in advance. If you don't receive a letter, please call our office to schedule the follow-up appointment.   If you need a refill on your cardiac medications before your next appointment, please call your pharmacy.   Thank you for choosing [facility]! Carrisa Keller, RN [phone]    

## 2017-07-04 NOTE — Progress Notes (Signed)
Cardiology Office Note   Date:  07/04/2017   ID:  Cynthia Baxter, DOB 1976-02-23, MRN [id number]  PCP:  Marguarite Arbour, MD  Cardiologist:   Cynthia Miss, MD   Chief Complaint  Patient presents with  . Palpitations   1. Sinus tachycardia 2 ADD 3. hypothyroidism   Previous notes:  Cynthia Baxter is a 41 y.o. female who presents for follow-up of her episodes of tachycardia. She stopped her adderall and here tachycardia has resolved. She has been diagnosed with hypothyroidism and has gained some weight. Is now exercising.   Feb 03, 2016:  Was having some fatigue.   Was started on Vit. B12. Switched to armor thyroid  Is exercising .   Yesterday , she had a mid sternal sensation that she could feel her heart beating.   Off and on all day .   Were not single isolated heart beats  Not related to menstral cycle.   Works as a Theatre manager.   Oct. 24, 2017:  Cynthia Baxter is doing well.  Has rare episodes of chest pressure Also has some palpitations  Has slowed her exercise some.   Work is going well.    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    Oct. 25, 2018:  Rare palps. HR is a bit high Has started Vyvance which has caused her HR to be faster Exercises some  Eating better.    No CP , dyspnea, no syncope    Past Medical History:  Diagnosis Date  . Acute nonsuppurative otitis media, unspecified   . Acute sinusitis, unspecified   . AMA (advanced maternal age) multigravida 35+   . Attention deficit disorder without mention of hyperactivity   . Attention deficit disorder without mention of hyperactivity   . H/O varicella   . Hypothyroid   . No pertinent past medical history   . Other and unspecified mycoses   . Pharyngitis   . Recurrent UTI (urinary tract infection)     Past Surgical History:  Procedure Laterality Date  . CESAREAN SECTION    . CESAREAN SECTION    . KNEE ARTHROSCOPY    . THYROIDECTOMY   03/30/2014     Current Outpatient Prescriptions  Medication Sig Dispense Refill  . cetirizine (ZYRTEC) 10 MG tablet Take 10 mg by mouth daily as needed for allergies.    Marland Kitchen lisdexamfetamine (VYVANSE) 40 MG capsule Take 40 mg by mouth every morning.    . loratadine (CLARITIN) 10 MG tablet Take 10 mg by mouth daily as needed for allergies.    . Multiple Vitamins tablet Take 1 tablet by mouth daily.    Marland Kitchen oxymetazoline (AFRIN) 0.05 % nasal spray Place 3 sprays into both nostrils daily as needed for congestion.    Marland Kitchen thyroid (ARMOUR) 15 MG tablet Take 15 mg by mouth daily.     . vitamin B-12 (CYANOCOBALAMIN) 1000 MCG tablet Take 1,000 mcg by mouth once.     No current facility-administered medications for this visit.     Allergies:   Codeine and Sulfa antibiotics    Social History:  The patient  reports that she has quit smoking. She has never used smokeless tobacco. She reports that she drinks alcohol. She reports that she does not use drugs.   Family History:  The patient's family history includes Anxiety disorder in her mother; Asthma in her mother; Breast cancer (age of onset: 18) in her maternal grandmother;  COPD in her mother; Cancer in her maternal grandmother; Heart attack in her maternal grandmother; Heart disease in her maternal grandmother; Heart disease (age of onset: 6454) in her father; Hypertension in her mother; Stroke in her maternal grandmother.    ROS:  Please see the history of present illness.    Review of Systems: Constitutional:  denies fever, chills, diaphoresis, appetite change and fatigue.  HEENT: denies photophobia, eye pain, redness, hearing loss, ear pain, congestion, sore throat, rhinorrhea, sneezing, neck pain, neck stiffness and tinnitus.  Respiratory: denies SOB, DOE, cough, chest tightness, and wheezing.  Cardiovascular: denies chest pain, palpitations and leg swelling.  Gastrointestinal: denies nausea, vomiting, abdominal pain, diarrhea, constipation, blood  in stool.  Genitourinary: denies dysuria, urgency, frequency, hematuria, flank pain and difficulty urinating.  Musculoskeletal: denies  myalgias, back pain, joint swelling, arthralgias and gait problem.   Skin: denies pallor, rash and wound.  Neurological: denies dizziness, seizures, syncope, weakness, light-headedness, numbness and headaches.   Hematological: denies adenopathy, easy bruising, personal or family bleeding history.  Psychiatric/ Behavioral: denies suicidal ideation, mood changes, confusion, nervousness, sleep disturbance and agitation.       All other systems are reviewed and negative.    EKG:  EKG is ordered today. Oct. 25, 2018:  NSR at 100.      Recent Labs: No results found for requested labs within last 8760 hours.    Lipid Panel    Component Value Date/Time   CHOL 174 10/05/2013 1117   TRIG 89 10/05/2013 1117   HDL 59 10/05/2013 1117   CHOLHDL 2.9 10/05/2013 1117   LDLCALC 97 10/05/2013 1117      Wt Readings from Last 3 Encounters:  07/04/17 140 lb (63.5 kg)  07/03/16 145 lb (65.8 kg)  02/03/16 145 lb 12.8 oz (66.1 kg)     ECG: 07/03/2016, total sinus rhythm at 83. Very minimal nonspecific ST abn.   - likely benign  Other studies Reviewed: Additional studies/ records that were reviewed today include: . Review of the above records demonstrates:    ASSESSMENT AND PLAN:  1.  Sinus tachycardia:  Sinus tach is well controlled We discussed starting a beta blocker at some point  At this point, she seems to be stable  She will take her HR on occasion   2. Soft systolic  murmur:    Has mild MR and TR , stable, anticipate repeat echo 10 years after previous one ( 2026)   3.  Palpitations :  Stable   Current medicines are reviewed at length with the patient today.  The patient does not have concerns regarding medicines.  The following changes have been made:  no change  Disposition:   FU with me in 1 year.    Signed, Cynthia MissPhilip Johnnathan Hagemeister, MD    07/04/2017 9:53 AM    [facility] [address], [place], [place]  [address] Phone: [phone]([phone]; Fax: [phone](336) [phone]

## 2017-07-18 DIAGNOSIS — Z23 Encounter for immunization: Secondary | ICD-10-CM | POA: Diagnosis not present

## 2017-07-18 DIAGNOSIS — M546 Pain in thoracic spine: Secondary | ICD-10-CM | POA: Diagnosis not present

## 2017-07-18 DIAGNOSIS — M50322 Other cervical disc degeneration at C5-C6 level: Secondary | ICD-10-CM | POA: Diagnosis not present

## 2017-07-18 DIAGNOSIS — M542 Cervicalgia: Secondary | ICD-10-CM | POA: Diagnosis not present

## 2017-07-31 DIAGNOSIS — Z13220 Encounter for screening for lipoid disorders: Secondary | ICD-10-CM | POA: Diagnosis not present

## 2017-07-31 DIAGNOSIS — E538 Deficiency of other specified B group vitamins: Secondary | ICD-10-CM | POA: Diagnosis not present

## 2017-07-31 DIAGNOSIS — Z79899 Other long term (current) drug therapy: Secondary | ICD-10-CM | POA: Diagnosis not present

## 2017-08-07 DIAGNOSIS — E890 Postprocedural hypothyroidism: Secondary | ICD-10-CM | POA: Diagnosis not present

## 2017-09-17 DIAGNOSIS — F902 Attention-deficit hyperactivity disorder, combined type: Secondary | ICD-10-CM | POA: Diagnosis not present

## 2017-09-17 DIAGNOSIS — E890 Postprocedural hypothyroidism: Secondary | ICD-10-CM | POA: Diagnosis not present

## 2017-10-11 DIAGNOSIS — J019 Acute sinusitis, unspecified: Secondary | ICD-10-CM | POA: Diagnosis not present

## 2017-10-11 DIAGNOSIS — H66003 Acute suppurative otitis media without spontaneous rupture of ear drum, bilateral: Secondary | ICD-10-CM | POA: Diagnosis not present

## 2017-10-11 DIAGNOSIS — B373 Candidiasis of vulva and vagina: Secondary | ICD-10-CM | POA: Diagnosis not present

## 2017-10-11 DIAGNOSIS — B9689 Other specified bacterial agents as the cause of diseases classified elsewhere: Secondary | ICD-10-CM | POA: Diagnosis not present

## 2017-12-10 DIAGNOSIS — J019 Acute sinusitis, unspecified: Secondary | ICD-10-CM | POA: Diagnosis not present

## 2017-12-10 DIAGNOSIS — Z79899 Other long term (current) drug therapy: Secondary | ICD-10-CM | POA: Diagnosis not present

## 2017-12-10 DIAGNOSIS — R030 Elevated blood-pressure reading, without diagnosis of hypertension: Secondary | ICD-10-CM | POA: Diagnosis not present

## 2018-01-27 DIAGNOSIS — Z113 Encounter for screening for infections with a predominantly sexual mode of transmission: Secondary | ICD-10-CM | POA: Diagnosis not present

## 2018-01-27 DIAGNOSIS — R5383 Other fatigue: Secondary | ICD-10-CM | POA: Diagnosis not present

## 2018-01-27 DIAGNOSIS — Z01419 Encounter for gynecological examination (general) (routine) without abnormal findings: Secondary | ICD-10-CM | POA: Diagnosis not present

## 2018-01-27 DIAGNOSIS — E890 Postprocedural hypothyroidism: Secondary | ICD-10-CM | POA: Diagnosis not present

## 2018-02-04 DIAGNOSIS — E890 Postprocedural hypothyroidism: Secondary | ICD-10-CM | POA: Diagnosis not present

## 2018-03-19 DIAGNOSIS — S3992XA Unspecified injury of lower back, initial encounter: Secondary | ICD-10-CM | POA: Diagnosis not present

## 2018-03-19 DIAGNOSIS — F902 Attention-deficit hyperactivity disorder, combined type: Secondary | ICD-10-CM | POA: Diagnosis not present

## 2018-03-19 DIAGNOSIS — M533 Sacrococcygeal disorders, not elsewhere classified: Secondary | ICD-10-CM | POA: Diagnosis not present

## 2018-03-19 DIAGNOSIS — Z79899 Other long term (current) drug therapy: Secondary | ICD-10-CM | POA: Diagnosis not present

## 2018-03-27 DIAGNOSIS — M25552 Pain in left hip: Secondary | ICD-10-CM | POA: Diagnosis not present

## 2018-03-31 DIAGNOSIS — H5213 Myopia, bilateral: Secondary | ICD-10-CM | POA: Diagnosis not present

## 2018-04-26 DIAGNOSIS — B019 Varicella without complication: Secondary | ICD-10-CM | POA: Diagnosis not present

## 2018-04-26 DIAGNOSIS — B029 Zoster without complications: Secondary | ICD-10-CM | POA: Diagnosis not present

## 2018-04-30 DIAGNOSIS — F902 Attention-deficit hyperactivity disorder, combined type: Secondary | ICD-10-CM | POA: Diagnosis not present

## 2018-06-01 ENCOUNTER — Encounter: Payer: Self-pay | Admitting: Emergency Medicine

## 2018-06-01 ENCOUNTER — Emergency Department
Admission: EM | Admit: 2018-06-01 | Discharge: 2018-06-01 | Disposition: A | Discharge status: Home / Self Care | Payer: BLUE CROSS/BLUE SHIELD | Attending: Emergency Medicine | Admitting: Emergency Medicine

## 2018-06-01 DIAGNOSIS — E039 Hypothyroidism, unspecified: Secondary | ICD-10-CM | POA: Diagnosis not present

## 2018-06-01 DIAGNOSIS — Z87891 Personal history of nicotine dependence: Secondary | ICD-10-CM | POA: Diagnosis not present

## 2018-06-01 DIAGNOSIS — R21 Rash and other nonspecific skin eruption: Secondary | ICD-10-CM | POA: Insufficient documentation

## 2018-06-01 DIAGNOSIS — Z79899 Other long term (current) drug therapy: Secondary | ICD-10-CM | POA: Diagnosis not present

## 2018-06-01 MED ORDER — PREDNISONE 10 MG PO TABS: ORAL_TABLET | ORAL | 0 refills | Status: DC

## 2018-06-01 MED ORDER — DIPHENHYDRAMINE HCL 25 MG PO CAPS: 25.0000 mg | ORAL_CAPSULE | ORAL | 0 refills | Status: AC | PRN

## 2018-06-01 MED ORDER — METHYLPREDNISOLONE SODIUM SUCC 125 MG IJ SOLR
125.0000 mg | Freq: Once | INTRAMUSCULAR | Status: AC
  Administered 2018-06-01: 125 mg via INTRAMUSCULAR
  Filled 2018-06-01: qty 2

## 2018-06-01 NOTE — ED Triage Notes (Signed)
Patient states that she thinks that she has shingles. She said she has itching and burning in her scalp, face and groin. Patient states that she had shingles about a month ago and it feels the same.

## 2018-06-01 NOTE — Discharge Instructions (Signed)
Please follow up with primary care in two days for further testing if symptoms do not improve with steroids and benadryl.

## 2018-06-01 NOTE — ED Provider Notes (Signed)
[facility] Emergency Department Provider Note  ____________________________________________  Time seen: Approximately 9:21 PM  I have reviewed the triage vital signs and the nursing notes.   HISTORY  Chief Complaint Rash    HPI Cynthia Baxter is a 42 y.o. female that presents emergency department for evaluation of rash, redness, itching, burning to scalp, back, arms for 1 day.  Patient states that the itching started in her scalp.  It is now spread to her back and arms.  She also states that she is having bumps over her groin and is not sure if this is razor burn.  She denies any new medications.  No new lotions, body washes, detergents.  She did start a new shampoo a couple of weeks ago.  She has pets but is unaware of any fleas.  No one else has a rash.  This is never happened before.  She has a primary care provider and has good follow-up with them.  No alleviating measures have been attempted.  Past Medical History:  Diagnosis Date  . Acute nonsuppurative otitis media, unspecified   . Acute sinusitis, unspecified   . AMA (advanced maternal age) multigravida 35+   . Attention deficit disorder without mention of hyperactivity   . Attention deficit disorder without mention of hyperactivity   . H/O varicella   . Hypothyroid   . No pertinent past medical history   . Other and unspecified mycoses   . Pharyngitis   . Recurrent UTI (urinary tract infection)     Patient Active Problem List   Diagnosis Date Noted  . Palpitations 02/03/2016  . Acute MEE (middle ear effusion) 01/30/2016  . Encounter for screening for certain developmental disorders in childhood 01/30/2016  . Cough 01/30/2016  . Fluttering heart 01/30/2016  . Adult hypothyroidism 01/30/2016  . Hypothyroidism, postop 01/30/2016  . Thyroid nodule 01/30/2016  . Lump in neck 01/30/2016  . Pharyngeal inflammation 01/30/2016  . Chest pain, pleuritic 01/30/2016  . PND (post-nasal drip)  01/30/2016  . Raynaud's syndrome without gangrene 01/30/2016  . Gonalgia 01/30/2016  . Acute antritis 01/30/2016  . Acute infection of nasal sinus 01/30/2016  . Feeling stressed out 01/30/2016  . Vision disturbance 01/30/2016  . Antibiotic-induced yeast infection 04/08/2015  . Dry cough 04/07/2015  . Nasal turbinate hypertrophy 04/07/2015  . Palpable lymph node 03/30/2015  . Mass of skin of left foot 03/30/2015  . Adult attention deficit disorder 03/30/2015  . Aortic insufficiency 11/15/2014  . Sinus tachycardia (HCC) 10/05/2013    Past Surgical History:  Procedure Laterality Date  . CESAREAN SECTION    . KNEE ARTHROSCOPY    . THYROIDECTOMY  03/30/2014    Prior to Admission medications   Medication Sig Start Date End Date Taking? Authorizing Provider  cetirizine (ZYRTEC) 10 MG tablet Take 10 mg by mouth daily as needed for allergies.    [provider]  diphenhydrAMINE (BENADRYL) 25 mg capsule Take 1 capsule (25 mg total) by mouth every 4 (four) hours as needed. 06/01/18 06/01/19  Enid Derry, PA-C  lisdexamfetamine (VYVANSE) 40 MG capsule Take 40 mg by mouth every morning. 06/27/17   [provider]  loratadine (CLARITIN) 10 MG tablet Take 10 mg by mouth daily as needed for allergies.    [provider]  Multiple Vitamins tablet Take 1 tablet by mouth daily.    [provider]  oxymetazoline (AFRIN) 0.05 % nasal spray Place 3 sprays into both nostrils daily as needed for congestion.  [provider]  predniSONE (DELTASONE) 10 MG tablet Take 6 tablets on day 1, take 5 tablets on day 2, take 4 tablets on day 3, take 3 tablets on day 4, take 2 tablets on day 5, take 1 tablet on day 6 06/01/18   Enid DerryWagner, Layne Lebon, PA-C  thyroid (ARMOUR) 15 MG tablet Take 15 mg by mouth daily.  01/18/16   [provider]  vitamin B-12 (CYANOCOBALAMIN) 1000 MCG tablet Take 1,000 mcg by mouth once.    [provider]    Allergies Codeine and  Sulfa antibiotics  Family History  Problem Relation Age of Onset  . Stroke Maternal Grandmother   . Heart attack Maternal Grandmother   . Heart disease Maternal Grandmother   . Cancer Maternal Grandmother   . Breast cancer Maternal Grandmother 60  . Asthma Mother   . Hypertension Mother   . COPD Mother   . Anxiety disorder Mother   . Heart disease Father [id number]       MI  . Other Neg Hx     Social History Social History   Tobacco Use  . Smoking status: Former Games developermoker  . Smokeless tobacco: Never Used  Substance Use Topics  . Alcohol use: Yes    Alcohol/week: 0.0 standard drinks    Comment: Rarely  . Drug use: No     Review of Systems  Constitutional: No fever/chills Cardiovascular: No chest pain. Respiratory: No SOB. Gastrointestinal: No abdominal pain.  No nausea, no vomiting. Musculoskeletal: Negative for musculoskeletal pain. Skin: Negative for abrasions, lacerations, ecchymosis. Positive for rash.   ____________________________________________   PHYSICAL EXAM:  VITAL SIGNS: ED Triage Vitals  Enc Vitals Group     BP 06/01/18 2008 135/89     Pulse Rate 06/01/18 2008 (!) 117     Resp 06/01/18 2008 18     Temp 06/01/18 2008 98.7 F (37.1 C)     Temp Source 06/01/18 2008 Oral     SpO2 06/01/18 2008 98 %     Weight 06/01/18 2011 140 lb (63.5 kg)     Height 06/01/18 2011 5\' 2"  (1.575 m)     Head Circumference --      Peak Flow --      Pain Score 06/01/18 2010 9     Pain Loc --      Pain Edu? --      Excl. in GC? --      Constitutional: Alert and oriented. Well appearing and in no acute distress. Eyes: Conjunctivae are normal. PERRL. EOMI. Head: Atraumatic.  No noted lice. ENT:      Ears:      Nose: No congestion/rhinnorhea.      Mouth/Throat: Mucous membranes are moist.  Neck: No stridor.   Cardiovascular: Normal rate, regular rhythm.  Good peripheral circulation. Respiratory: Normal respiratory effort without tachypnea or retractions. Lungs CTAB.  Good air entry to the bases with no decreased or absent breath sounds. Gastrointestinal: Bowel sounds 4 quadrants. Soft and nontender to palpation. No guarding or rigidity. No palpable masses. No distention.  Musculoskeletal: Full range of motion to all extremities. No gross deformities appreciated. Neurologic:  Normal speech and language. No gross focal neurologic deficits are appreciated.  Skin:  Skin is warm, dry and intact.  Faint pink macular rash to bilateral arms and upper back.  Bright red rash surrounding hair follicles of groin. Psychiatric: Mood and affect are normal. Speech and behavior are normal. Patient exhibits appropriate insight and judgement.   ____________________________________________  LABS (all labs ordered are listed, but only abnormal results are displayed)  Labs Reviewed - No data to display ____________________________________________  EKG   ____________________________________________  RADIOLOGY   No results found.  ____________________________________________    PROCEDURES  Procedure(s) performed:    Procedures    Medications  methylPREDNISolone sodium succinate (SOLU-MEDROL) 125 mg/2 mL injection 125 mg (125 mg Intramuscular Given 06/01/18 2217)     ____________________________________________   INITIAL IMPRESSION / ASSESSMENT AND PLAN / ED COURSE  Pertinent labs & imaging results that were available during my care of the patient were reviewed by me and considered in my medical decision making (see chart for details).  Review of the [place] CSRS was performed in accordance of the [facility] prior to dispensing any controlled drugs.     Patient presented to emergency department for evaluation of rash.  Rash in groin is consistent with razor burn.  No indication of shingles.  Rash is not dermatomal.  Rash to arms and back is likely an irritant dermatitis.  I suspect that anxiety is contributing as patient is very anxious in the room.  Blood  work was offered and patient declines.  She would like to follow-up with primary care in 2 days if symptoms do not resolve with Benadryl and prednisone.  Patient will be discharged home with prescriptions for prednisone and Benadryl. Patient is to follow up with primary care as directed. Patient is given ED precautions to return to the ED for any worsening or new symptoms.     ____________________________________________  FINAL CLINICAL IMPRESSION(S) / ED DIAGNOSES  Final diagnoses:  Rash      NEW MEDICATIONS STARTED DURING THIS VISIT:  ED Discharge Orders         Ordered    predniSONE (DELTASONE) 10 MG tablet     06/01/18 2200    diphenhydrAMINE (BENADRYL) 25 mg capsule  Every 4 hours PRN     06/01/18 2200              This chart was dictated using voice recognition software/Dragon. Despite best efforts to proofread, errors can occur which can change the meaning. Any change was purely unintentional.    Enid Derry, PA-C 06/01/18 2245    Arnaldo Natal, MD 06/02/18 [phone]

## 2018-06-01 NOTE — ED Notes (Addendum)
Rash and  Redness  On back x  3  Days  With  Tingling  Sensation with  Burning type  Sensation has had history of  Shingles  About 1 month   No  New  meds  The rash itches  And has small  Bumps in perineal area  Tingling sensation  Also has tingling in scalp and face

## 2018-06-18 ENCOUNTER — Other Ambulatory Visit: Payer: Self-pay | Admitting: Internal Medicine

## 2018-06-18 DIAGNOSIS — Z1231 Encounter for screening mammogram for malignant neoplasm of breast: Secondary | ICD-10-CM

## 2018-07-01 ENCOUNTER — Encounter: Payer: Self-pay | Admitting: Cardiovascular Disease

## 2018-07-11 DIAGNOSIS — S8992XA Unspecified injury of left lower leg, initial encounter: Secondary | ICD-10-CM | POA: Diagnosis not present

## 2018-07-11 DIAGNOSIS — S8002XA Contusion of left knee, initial encounter: Secondary | ICD-10-CM | POA: Diagnosis not present

## 2018-07-11 DIAGNOSIS — S8991XA Unspecified injury of right lower leg, initial encounter: Secondary | ICD-10-CM | POA: Diagnosis not present

## 2018-07-22 ENCOUNTER — Encounter (INDEPENDENT_AMBULATORY_CARE_PROVIDER_SITE_OTHER): Payer: Self-pay

## 2018-07-22 ENCOUNTER — Encounter: Payer: Self-pay | Admitting: Cardiovascular Disease

## 2018-07-22 ENCOUNTER — Ambulatory Visit (INDEPENDENT_AMBULATORY_CARE_PROVIDER_SITE_OTHER): Payer: BLUE CROSS/BLUE SHIELD | Admitting: Cardiovascular Disease

## 2018-07-22 VITALS — BP 118/80 | HR 102 | Ht 62.0 in | Wt 144.0 lb

## 2018-07-22 DIAGNOSIS — R000 Tachycardia, unspecified: Secondary | ICD-10-CM

## 2018-07-22 MED ORDER — METOPROLOL TARTRATE 25 MG PO TABS: 12.5000 mg | ORAL_TABLET | Freq: Two times a day (BID) | ORAL | 3 refills | Status: DC

## 2018-07-22 NOTE — Patient Instructions (Signed)
Medication Instructions:  Your physician has recommended you make the following change in your medication:   START Metoprolol Tartrate (Lopressor) 12.5 mg twice daily  If you need a refill on your cardiac medications before your next appointment, please call your pharmacy.   Lab work: None Ordered  If you have labs (blood work) drawn today and your tests are completely normal, you will receive your results only by: Marland Kitchen MyChart Message (if you have MyChart) OR . A paper copy in the mail If you have any lab test that is abnormal or we need to change your treatment, we will call you to review the results.  Testing/Procedures: None Ordered  Follow-Up: At [facility], you and your health needs are our priority.  As part of our continuing mission to provide you with exceptional heart care, we have created designated Provider Care Teams.  These Care Teams include your primary Cardiologist (physician) and Advanced Practice Providers (APPs -  Physician Assistants and Nurse Practitioners) who all work together to provide you with the care you need, when you need it. You will need a follow up appointment in:  6 months.  Please call our office 2 months in advance to schedule this appointment.  You may see Kristeen Miss, MD or one of the following Advanced Practice Providers on your designated Care Team: Tereso Newcomer, PA-C [place], [place] . Berton Bon, NP

## 2018-07-22 NOTE — Progress Notes (Signed)
Cardiology Office Note   Date:  07/22/2018   ID:  Cynthia Baxter, DOB [birth year], MRN [id number]  PCP:  Marguarite Arbour, MD  Cardiologist:   Kristeen Miss, MD   Chief Complaint  Patient presents with  . Tachycardia   1. Sinus tachycardia 2 ADD 3. hypothyroidism   Previous notes:  Cynthia Baxter is a 42 y.o. female who presents for follow-up of her episodes of tachycardia. She stopped her adderall and here tachycardia has resolved. She has been diagnosed with hypothyroidism and has gained some weight. Is now exercising.   Feb 03, 2016:  Was having some fatigue.   Was started on Vit. B12. Switched to armor thyroid  Is exercising .   Yesterday , she had a mid sternal sensation that she could feel her heart beating.   Off and on all day .   Were not single isolated heart beats  Not related to menstral cycle.   Works as a Theatre manager.   Oct. 24, 2017:  Cynthia Baxter is doing well.  Has rare episodes of chest pressure Also has some palpitations  Has slowed her exercise some.   Work is going well.    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    Oct. 25, 2018:  Rare palps. HR is a bit high Has started Vyvance which has caused her HR to be faster Exercises some  Eating better.    No CP , dyspnea, no syncope   Nov. 12, 2019: Cynthia Baxter is seen today for follow up of her tachycardia . Denies any palpitations  Has some anxiety issues / marital sterss Switched from [place] to Adderall 30 mg BID   HR remains elevated . Has lots of anxiety issues    Past Medical History:  Diagnosis Date  . Acute nonsuppurative otitis media, unspecified   . Acute sinusitis, unspecified   . AMA (advanced maternal age) multigravida 35+   . Attention deficit disorder without mention of hyperactivity   . Attention deficit disorder without mention of hyperactivity   . H/O varicella   . Hypothyroid   . No pertinent past medical history     . Other and unspecified mycoses   . Pharyngitis   . Recurrent UTI (urinary tract infection)     Past Surgical History:  Procedure Laterality Date  . CESAREAN SECTION    . KNEE ARTHROSCOPY    . THYROIDECTOMY  03/30/2014     Current Outpatient Medications  Medication Sig Dispense Refill  . amphetamine-dextroamphetamine (ADDERALL) 30 MG tablet Take 1 tablet (30 mg total) by mouth 2 (two) times daily    . cetirizine (ZYRTEC) 10 MG tablet Take 10 mg by mouth daily as needed for allergies.    . diphenhydrAMINE (BENADRYL) 25 mg capsule Take 1 capsule (25 mg total) by mouth every 4 (four) hours as needed. 30 capsule 0  . loratadine (CLARITIN) 10 MG tablet Take 10 mg by mouth daily as needed for allergies.    . Multiple Vitamins tablet Take 1 tablet by mouth daily.    Marland Kitchen thyroid (ARMOUR) 15 MG tablet Take 15 mg by mouth daily.     . vitamin B-12 (CYANOCOBALAMIN) 1000 MCG tablet Take 1,000 mcg by mouth once.     No current facility-administered medications for this visit.     Allergies:   Codeine and Sulfa antibiotics    Social History:  The patient  reports that she has quit smoking.  She has never used smokeless tobacco. She reports that she drinks alcohol. She reports that she does not use drugs.   Family History:  The patient's family history includes Anxiety disorder in her mother; Asthma in her mother; Breast cancer (age of onset: 1) in her maternal grandmother; COPD in her mother; Cancer in her maternal grandmother; Heart attack in her maternal grandmother; Heart disease in her maternal grandmother; Heart disease (age of onset: 45) in her father; Hypertension in her mother; Stroke in her maternal grandmother.    ROS:  Please see the history of present illness.    Physical Exam: Blood pressure 118/80, pulse (!) 102, height 5\' 2"  (1.575 m), weight 144 lb (65.3 kg), SpO2 99 %, unknown if currently breastfeeding.  GEN:   Young female,  NAD  HEENT: Normal NECK: No JVD; No carotid  bruits LYMPHATICS: No lymphadenopathy CARDIAC: RR, tachycardic,   Soft systolic murmur  RESPIRATORY:  Clear to auscultation without rales, wheezing or rhonchi  ABDOMEN: Soft, non-tender, non-distended MUSCULOSKELETAL:  No edema; No deformity  SKIN: Warm and dry NEUROLOGIC:  Alert and oriented x 3    Recent Labs: No results found for requested labs within last 8760 hours.    Lipid Panel    Component Value Date/Time   CHOL 174 10/05/2013 1117   TRIG 89 10/05/2013 1117   HDL 59 10/05/2013 1117   CHOLHDL 2.9 10/05/2013 1117   LDLCALC 97 10/05/2013 1117      Wt Readings from Last 3 Encounters:  07/22/18 144 lb (65.3 kg)  06/01/18 140 lb (63.5 kg)  07/04/17 140 lb (63.5 kg)     ECG:  :    Additional studies/ records that were reviewed today include: . Review of the above records demonstrates:    ASSESSMENT AND PLAN:  1.  Sinus tachycardia:   She continues to have episodes of sinus tachycardia.  We will start her on metoprolol 12.5 mg twice a day.  2. Soft systolic  murmur:   Has mild MR and TR    3.  Palpitations :  Stable   Current medicines are reviewed at length with the patient today.  The patient does not have concerns regarding medicines.  The following changes have been made:  no change  Disposition:   FU with me in 6 months   Signed, Kristeen Miss, MD  07/22/2018 11:34 AM    [facility] [address] [place], [place], [place]  [address] Phone: [phone]; Fax: [phone]

## 2018-07-31 DIAGNOSIS — F902 Attention-deficit hyperactivity disorder, combined type: Secondary | ICD-10-CM | POA: Diagnosis not present

## 2018-08-14 DIAGNOSIS — E890 Postprocedural hypothyroidism: Secondary | ICD-10-CM | POA: Diagnosis not present

## 2018-08-14 DIAGNOSIS — Z79899 Other long term (current) drug therapy: Secondary | ICD-10-CM | POA: Diagnosis not present

## 2018-08-25 ENCOUNTER — Ambulatory Visit
Admission: RE | Admit: 2018-08-25 | Discharge: 2018-08-25 | Disposition: A | Discharge status: Home / Self Care | Payer: BLUE CROSS/BLUE SHIELD | Source: Ambulatory Visit | Attending: Internal Medicine | Admitting: Internal Medicine

## 2018-08-25 DIAGNOSIS — Z1231 Encounter for screening mammogram for malignant neoplasm of breast: Secondary | ICD-10-CM

## 2018-10-09 DIAGNOSIS — S99921A Unspecified injury of right foot, initial encounter: Secondary | ICD-10-CM | POA: Diagnosis not present

## 2018-10-09 DIAGNOSIS — S92424A Nondisplaced fracture of distal phalanx of right great toe, initial encounter for closed fracture: Secondary | ICD-10-CM | POA: Diagnosis not present

## 2018-10-24 ENCOUNTER — Telehealth: Payer: Self-pay | Admitting: Cardiovascular Disease

## 2018-10-24 DIAGNOSIS — S90111D Contusion of right great toe without damage to nail, subsequent encounter: Secondary | ICD-10-CM | POA: Diagnosis not present

## 2018-10-24 MED ORDER — METOPROLOL TARTRATE 25 MG PO TABS: 25.0000 mg | ORAL_TABLET | Freq: Two times a day (BID) | ORAL | 3 refills | Status: DC

## 2018-10-24 NOTE — Telephone Encounter (Signed)
Left message for patient that I am returning her call and will try again before I leave the office today (Friday)  Spoke with patient who states she got confused after her office visit in November and has been taking Metoprolol 25 mg BID instead of 12.5 mg BID. She denies dizziness, light-headedness, near-syncope or other concerns. She states on the nights she forgets to take the medication before going to bed, she wakes up with her heart pounding. She states HR has not been below 80 bpm. I advised her to continue current dose of 25 mg BID and to call back if HR slows to <60 bpm or if she develops symptoms discussed above. She is due for May f/u and states she will call back to schedule when she has her work calendar in front of her. She verbalized agreement with plan and thanked me for the call.

## 2018-10-24 NOTE — Telephone Encounter (Signed)
New Message   Pt c/o medication issue:  1. Name of Medication: Metoprolol  2. How are you currently taking this medication (dosage and times per day)? 25mg  1/2 pill in the am and 1/2 at night  3. Are you having a reaction (difficulty breathing--STAT)? No   4. What is your medication issue? Patient has been taking a whole pill twice a day and wants to speak to nurse.

## 2018-10-26 NOTE — Telephone Encounter (Signed)
Metoprolol 25 BID is a good dose for her if she tolerates. Agree with note by Eligha Bridegroom, RN

## 2019-01-15 DIAGNOSIS — F902 Attention-deficit hyperactivity disorder, combined type: Secondary | ICD-10-CM | POA: Diagnosis not present

## 2019-01-15 DIAGNOSIS — M79671 Pain in right foot: Secondary | ICD-10-CM | POA: Diagnosis not present

## 2019-01-15 DIAGNOSIS — E890 Postprocedural hypothyroidism: Secondary | ICD-10-CM | POA: Diagnosis not present

## 2019-02-20 DIAGNOSIS — R399 Unspecified symptoms and signs involving the genitourinary system: Secondary | ICD-10-CM | POA: Diagnosis not present

## 2019-03-06 IMAGING — MG MM DIGITAL SCREENING BILAT W/ CAD
5 series · 5 of 5 positions shown · non-contrast
Comparison: Previous exam(s).

CLINICAL DATA: Screening.

EXAM:
DIGITAL SCREENING BILATERAL MAMMOGRAM WITH CAD

[R CC (1 of 2)]
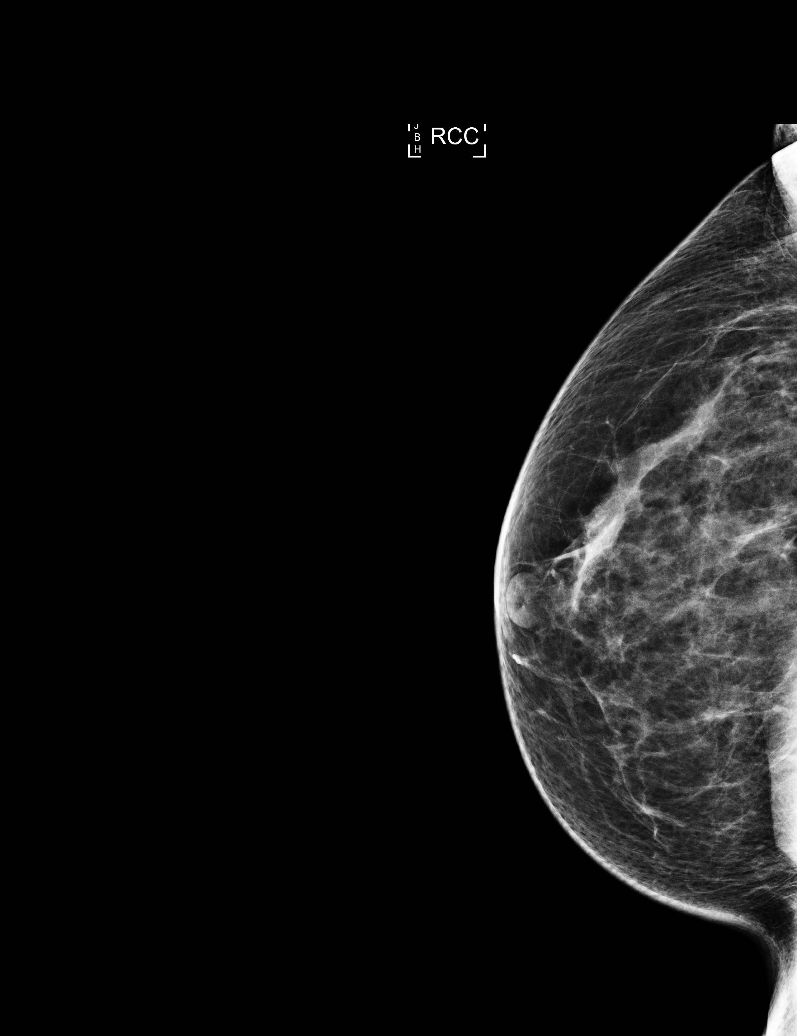

[R MLO]
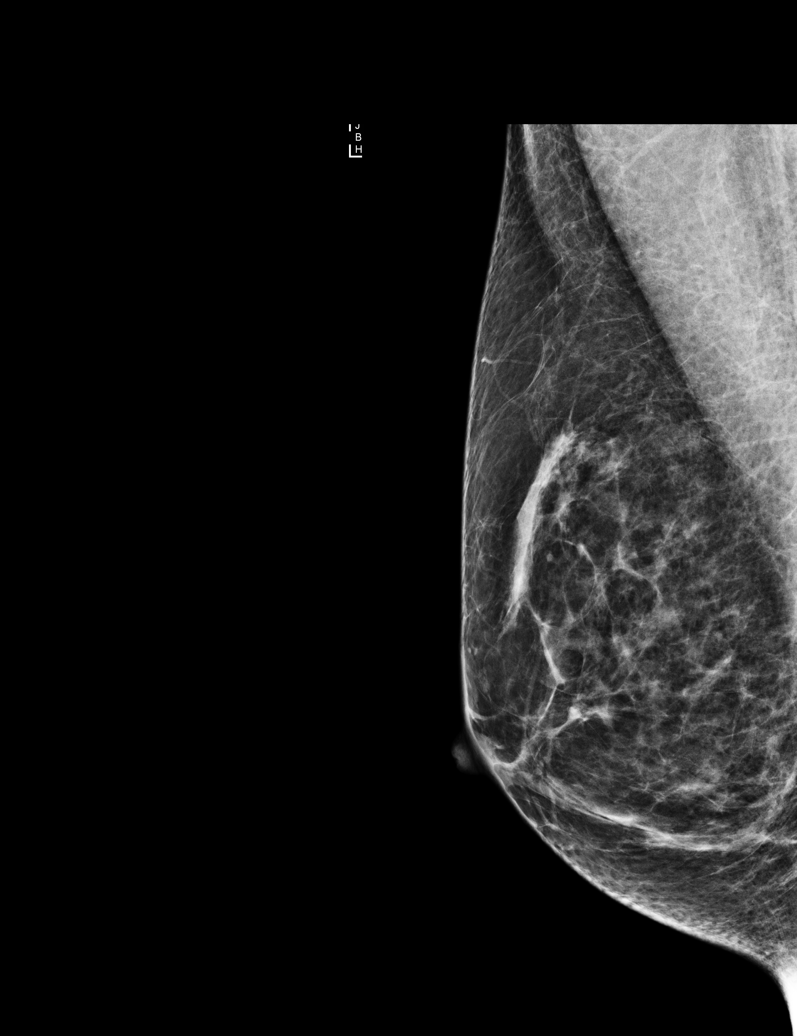

[L MLO]
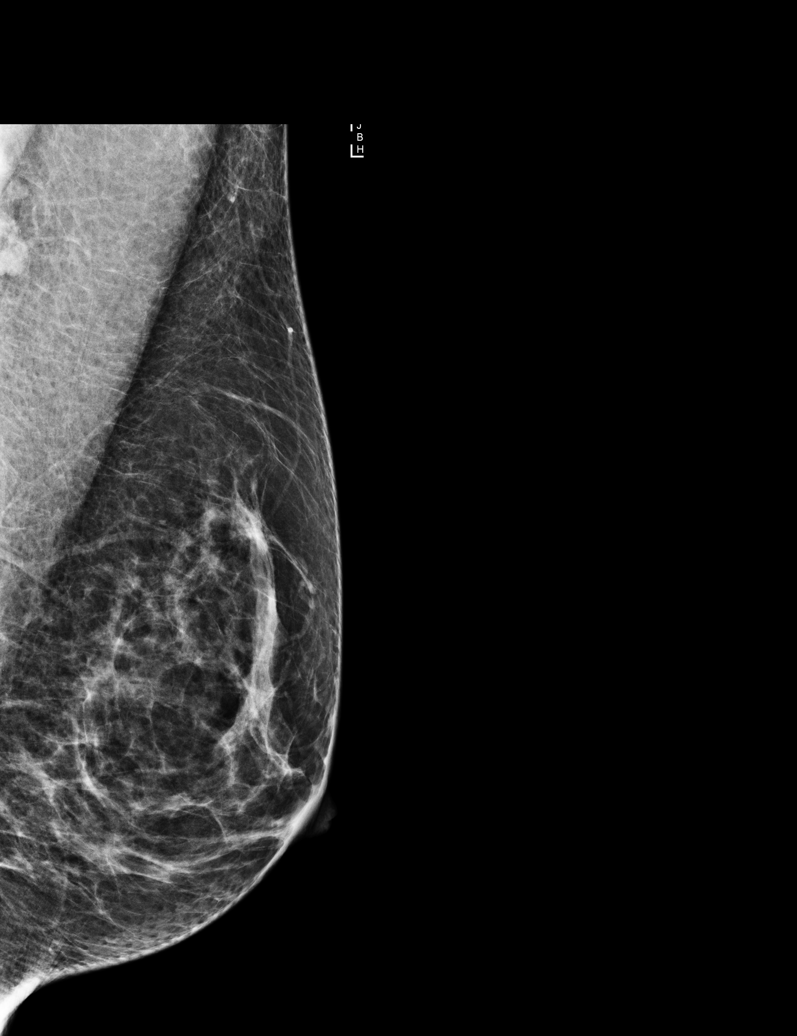

[L CC]
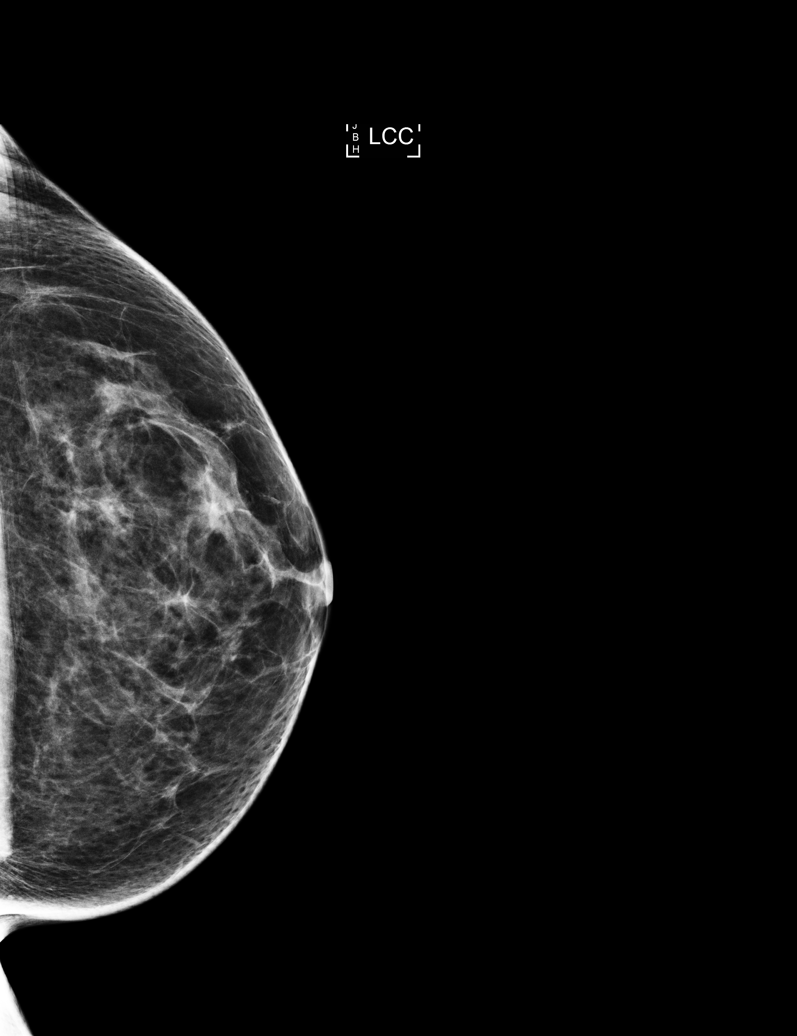

[R CC (2 of 2)]
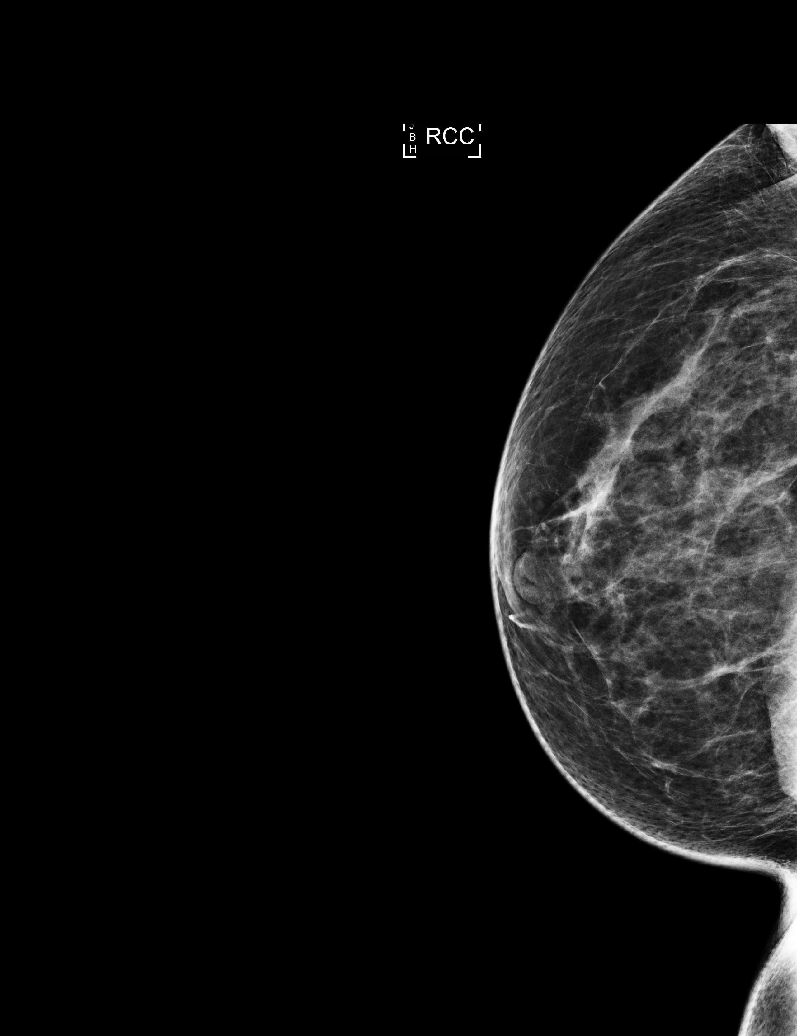

[5 of 5 positions shown; findings below may reference images not displayed]

ACR Breast Density Category b: There are scattered areas of
fibroglandular density.
FINDINGS: There are no findings suspicious for malignancy. Images were
processed with CAD.
IMPRESSION: No mammographic evidence of malignancy. A result letter of this
screening mammogram will be mailed directly to the patient.

RECOMMENDATION:
Screening mammogram in one year. (Code:AS-G-LCT)

BI-RADS CATEGORY  1: Negative.

## 2019-04-13 ENCOUNTER — Ambulatory Visit: Payer: BLUE CROSS/BLUE SHIELD | Admitting: Cardiovascular Disease

## 2019-06-29 ENCOUNTER — Telehealth: Payer: Self-pay | Admitting: Cardiovascular Disease

## 2019-06-29 NOTE — Telephone Encounter (Signed)
 *  STAT* If patient is at the pharmacy, call can be transferred to refill team.   1. Which medications need to be refilled? (please list name of each medication and dose if known)metoprolol tartrate (LOPRESSOR) 25 MG tablet  2. Which pharmacy/location (including street and city if local pharmacy) is medication to be sent to? [place]  3. Do they need a 30 day or 90 day supply? [name]

## 2019-06-29 NOTE — Telephone Encounter (Signed)
Called pt and left a message informing pt that she has refills left at her pharmacy already and that I would call her pharmacy as well. I called pt's pharmacy and requested a refill for the pt and the pharmacy is getting pt's medication metoprolol ready for pt to pick up.

## 2019-07-02 ENCOUNTER — Telehealth: Payer: Self-pay | Admitting: Nurse Practitioner

## 2019-07-02 NOTE — Telephone Encounter (Signed)
Left message that Dr. Acie Fredrickson will be in the office on 10/28 and requested patient to call back to confirm visit type. She is currently scheduled as virtual.

## 2019-07-08 ENCOUNTER — Telehealth (INDEPENDENT_AMBULATORY_CARE_PROVIDER_SITE_OTHER): Payer: BLUE CROSS/BLUE SHIELD | Admitting: Cardiovascular Disease

## 2019-07-08 ENCOUNTER — Other Ambulatory Visit: Payer: Self-pay

## 2019-07-08 NOTE — Progress Notes (Signed)
No show  Did not answer phone

## 2019-07-28 ENCOUNTER — Other Ambulatory Visit: Payer: Self-pay | Admitting: Internal Medicine

## 2019-07-28 DIAGNOSIS — Z1231 Encounter for screening mammogram for malignant neoplasm of breast: Secondary | ICD-10-CM

## 2019-08-28 ENCOUNTER — Other Ambulatory Visit: Payer: Self-pay

## 2019-08-28 ENCOUNTER — Ambulatory Visit
Admission: RE | Admit: 2019-08-28 | Discharge: 2019-08-28 | Disposition: A | Discharge status: Home / Self Care | Payer: Managed Care, Other (non HMO) | Source: Ambulatory Visit | Attending: Internal Medicine | Admitting: Internal Medicine

## 2019-08-28 DIAGNOSIS — Z1231 Encounter for screening mammogram for malignant neoplasm of breast: Secondary | ICD-10-CM | POA: Insufficient documentation

## 2019-09-21 ENCOUNTER — Encounter: Payer: Self-pay | Admitting: Cardiovascular Disease

## 2019-09-21 NOTE — Progress Notes (Signed)
Pt was a no show

## 2019-09-22 ENCOUNTER — Ambulatory Visit (INDEPENDENT_AMBULATORY_CARE_PROVIDER_SITE_OTHER): Payer: Managed Care, Other (non HMO) | Admitting: Cardiovascular Disease

## 2019-09-22 DIAGNOSIS — R000 Tachycardia, unspecified: Secondary | ICD-10-CM

## 2019-09-30 ENCOUNTER — Ambulatory Visit: Payer: Managed Care, Other (non HMO) | Admitting: Cardiovascular Disease

## 2019-12-09 ENCOUNTER — Other Ambulatory Visit: Payer: Self-pay | Admitting: Internal Medicine

## 2019-12-09 DIAGNOSIS — R1084 Generalized abdominal pain: Secondary | ICD-10-CM

## 2019-12-15 ENCOUNTER — Ambulatory Visit
Admission: RE | Admit: 2019-12-15 | Discharge: 2019-12-15 | Disposition: A | Discharge status: Home / Self Care | Payer: 59 | Source: Ambulatory Visit | Attending: Internal Medicine | Admitting: Internal Medicine

## 2019-12-15 ENCOUNTER — Other Ambulatory Visit: Payer: Self-pay

## 2019-12-15 DIAGNOSIS — R1084 Generalized abdominal pain: Secondary | ICD-10-CM | POA: Insufficient documentation

## 2019-12-29 ENCOUNTER — Other Ambulatory Visit: Payer: Self-pay

## 2019-12-29 MED ORDER — METOPROLOL TARTRATE 25 MG PO TABS: 25.0000 mg | ORAL_TABLET | Freq: Two times a day (BID) | ORAL | 2 refills | Status: AC

## 2019-12-31 ENCOUNTER — Encounter: Payer: Self-pay | Admitting: Cardiovascular Disease

## 2019-12-31 NOTE — Progress Notes (Signed)
no show

## 2020-01-01 ENCOUNTER — Ambulatory Visit (INDEPENDENT_AMBULATORY_CARE_PROVIDER_SITE_OTHER): Payer: Self-pay | Admitting: Cardiovascular Disease

## 2020-01-01 DIAGNOSIS — Z5329 Procedure and treatment not carried out because of patient's decision for other reasons: Secondary | ICD-10-CM

## 2020-01-26 DIAGNOSIS — Z91199 Patient's noncompliance with other medical treatment and regimen due to unspecified reason: Secondary | ICD-10-CM | POA: Insufficient documentation

## 2020-05-19 DIAGNOSIS — Z1151 Encounter for screening for human papillomavirus (HPV): Secondary | ICD-10-CM | POA: Diagnosis not present

## 2020-05-19 DIAGNOSIS — Z01419 Encounter for gynecological examination (general) (routine) without abnormal findings: Secondary | ICD-10-CM | POA: Diagnosis not present

## 2020-05-19 DIAGNOSIS — H5213 Myopia, bilateral: Secondary | ICD-10-CM | POA: Diagnosis not present

## 2020-05-19 DIAGNOSIS — Z1331 Encounter for screening for depression: Secondary | ICD-10-CM | POA: Diagnosis not present

## 2020-05-23 DIAGNOSIS — F902 Attention-deficit hyperactivity disorder, combined type: Secondary | ICD-10-CM | POA: Diagnosis not present

## 2020-05-23 DIAGNOSIS — R140 Abdominal distension (gaseous): Secondary | ICD-10-CM | POA: Diagnosis not present

## 2020-05-23 DIAGNOSIS — E890 Postprocedural hypothyroidism: Secondary | ICD-10-CM | POA: Diagnosis not present

## 2020-05-23 DIAGNOSIS — E538 Deficiency of other specified B group vitamins: Secondary | ICD-10-CM | POA: Diagnosis not present

## 2020-05-23 DIAGNOSIS — Z23 Encounter for immunization: Secondary | ICD-10-CM | POA: Diagnosis not present

## 2020-05-23 DIAGNOSIS — Z79899 Other long term (current) drug therapy: Secondary | ICD-10-CM | POA: Diagnosis not present

## 2020-07-14 ENCOUNTER — Encounter: Payer: Self-pay | Admitting: Cardiovascular Disease

## 2020-07-14 NOTE — Progress Notes (Signed)
Cardiology Office Note   Date:  07/15/2020   ID:  Braley Luckenbaugh, DOB June 08, 1976, MRN [id number]  PCP:  Marguarite Arbour, MD  Cardiologist:   Kristeen Miss, MD   Chief Complaint  Patient presents with  . Palpitations   1. Sinus tachycardia 2 ADD 3. hypothyroidism   Previous notes:  Talyn Marifer Hurd is a 44 y.o. female who presents for follow-up of her episodes of tachycardia. She stopped her adderall and here tachycardia has resolved. She has been diagnosed with hypothyroidism and has gained some weight. Is now exercising.   Feb 03, 2016:  Was having some fatigue.   Was started on Vit. B12. Switched to armor thyroid  Is exercising .   Yesterday , she had a mid sternal sensation that she could feel her heart beating.   Off and on all day .   Were not single isolated heart beats  Not related to menstral cycle.   Works as a Theatre manager.   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 24, 2017:  Ashlynn is doing well.  Has rare episodes of chest pressure Also has some palpitations  Has slowed her exercise some.   Work is going well.    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    Oct. 25, 2018:  Rare palps. HR is a bit high Has started Vyvance which has caused her HR to be faster Exercises some  Eating better.    No CP , dyspnea, no syncope   Nov. 12, 2019: Francely is seen today for follow up of her tachycardia . Denies any palpitations  Has some anxiety issues / marital sterss Switched from [place] to Adderall 30 mg BID   HR remains elevated . Has lots of anxiety issues   Nov. 5, 2021: Tyresa is seen today for follow up visit She has a hx of anxiety and sinus tachycardia Has had some unusual sensation on occasion fluttering sensation  Seems to occur when she has taken her metoprolol as soon as she should .  Has started back walking on the treadmill , 4 days a week.   25-40 minutes.  No symptoms while on the treadmill or afterward.       Past Medical History:  Diagnosis Date  . Acute nonsuppurative otitis media, unspecified   . Acute sinusitis, unspecified   . AMA (advanced maternal age) multigravida 35+   . Attention deficit disorder without mention of hyperactivity   . Attention deficit disorder without mention of hyperactivity   . H/O varicella   . Hypothyroid   . No pertinent past medical history   . Other and unspecified mycoses   . Pharyngitis   . Recurrent UTI (urinary tract infection)     Past Surgical History:  Procedure Laterality Date  . CESAREAN SECTION    . KNEE ARTHROSCOPY    . THYROIDECTOMY  03/30/2014     Current Outpatient Medications  Medication Sig Dispense Refill  . amphetamine-dextroamphetamine (ADDERALL) 30 MG tablet Take 1 tablet by mouth 2 (two) times daily.    . cetirizine (ZYRTEC) 10 MG tablet Take 10 mg by mouth daily as needed for allergies.    . cyclobenzaprine (FLEXERIL) 10 MG tablet Take 1 tablet by mouth as needed. For muscle spasms    . diphenhydrAMINE (BENADRYL) 25 mg capsule Take 1 capsule (25 mg total) by mouth every 4 (four) hours as needed. 30 capsule 0  . loratadine (CLARITIN) 10 MG tablet Take  10 mg by mouth daily as needed for allergies.    . metoprolol tartrate (LOPRESSOR) 25 MG tablet Take 1 tablet (25 mg total) by mouth 2 (two) times daily. 180 tablet 2  . Multiple Vitamins tablet Take 1 tablet by mouth daily.    Marland Kitchen omeprazole (PRILOSEC) 40 MG capsule Take 40 mg by mouth daily.    Marland Kitchen thyroid (ARMOUR) 15 MG tablet Take 15 mg by mouth daily.     . vitamin B-12 (CYANOCOBALAMIN) 1000 MCG tablet Take 1,000 mcg by mouth once.     No current facility-administered medications for this visit.    Allergies:   Codeine and Sulfa antibiotics    Social History:  The patient  reports that she has quit smoking. She has never used smokeless tobacco. She reports current alcohol use. She reports that she does not use drugs.   Family History:  The patient's family history  includes Anxiety disorder in her mother; Asthma in her mother; Breast cancer (age of onset: 31) in her maternal grandmother; COPD in her mother; Cancer in her maternal grandmother; Heart attack in her maternal grandmother; Heart disease in her maternal grandmother; Heart disease (age of onset: 66) in her father; Hypertension in her mother; Stroke in her maternal grandmother.    ROS:  Please see the history of present illness.    Physical Exam: Blood pressure 100/68, pulse 65, height 5\' 2"  (1.575 m), weight 149 lb (67.6 kg), SpO2 98 %, unknown if currently breastfeeding.  GEN:  Well nourished, well developed in no acute distress HEENT: Normal NECK: No JVD; No carotid bruits LYMPHATICS: No lymphadenopathy CARDIAC: RRR , no murmurs, rubs, gallops RESPIRATORY:  Clear to auscultation without rales, wheezing or rhonchi  ABDOMEN: Soft, non-tender, non-distended MUSCULOSKELETAL:  No edema; No deformity  SKIN: Warm and dry NEUROLOGIC:  Alert and oriented x 3   Recent Labs: No results found for requested labs within last 8760 hours.    Lipid Panel    Component Value Date/Time   CHOL 174 10/05/2013 1117   TRIG 89 10/05/2013 1117   HDL 59 10/05/2013 1117   CHOLHDL 2.9 10/05/2013 1117   LDLCALC 97 10/05/2013 1117      Wt Readings from Last 3 Encounters:  07/15/20 149 lb (67.6 kg)  07/22/18 144 lb (65.3 kg)  06/01/18 140 lb (63.5 kg)     ECG:  : July 15, 2020: Normal sinus rhythm at 65.  No ST or T wave changes.    Additional studies/ records that were reviewed today include: . Review of the above records demonstrates:    ASSESSMENT AND PLAN:  1.  Sinus tachycardia:   She is not having further episodes of sinus tachycardia.  Continue current medications.   2. Soft systolic  murmur:   I did not hear any significant murmur today.  She has a echocardiogram from 2016 which revealed mild MR and mild TR.   3.  Palpitations :   She continues to have intermittent episodes of  hard heartbeats.  Her heart does not necessarily go any faster.  I have advised her to see if she has any associations on when these might occur. I will see her again in 1 year.    Current medicines are reviewed at length with the patient today.  The patient does not have concerns regarding medicines.  The following changes have been made:  no change  Disposition:    Signed, 2017, MD  07/15/2020 2:37 PM    [facility] [address], [place], [place]  [address] Phone: [phone]; Fax: [phone]

## 2020-07-15 ENCOUNTER — Ambulatory Visit (INDEPENDENT_AMBULATORY_CARE_PROVIDER_SITE_OTHER): Payer: 59 | Admitting: Cardiovascular Disease

## 2020-07-15 ENCOUNTER — Other Ambulatory Visit: Payer: Self-pay

## 2020-07-15 ENCOUNTER — Encounter: Payer: Self-pay | Admitting: Cardiovascular Disease

## 2020-07-15 VITALS — BP 100/68 | HR 65 | Ht 62.0 in | Wt 149.0 lb

## 2020-07-15 DIAGNOSIS — R000 Tachycardia, unspecified: Secondary | ICD-10-CM | POA: Diagnosis not present

## 2020-07-15 DIAGNOSIS — R002 Palpitations: Secondary | ICD-10-CM | POA: Diagnosis not present

## 2020-07-15 NOTE — Patient Instructions (Signed)
Medication Instructions:  Your physician recommends that you continue on your current medications as directed. Please refer to the Current Medication list given to you today.  *If you need a refill on your cardiac medications before your next appointment, please call your pharmacy*   Lab Work: none If you have labs (blood work) drawn today and your tests are completely normal, you will receive your results only by: MyChart Message (if you have MyChart) OR A paper copy in the mail If you have any lab test that is abnormal or we need to change your treatment, we will call you to review the results.   Testing/Procedures: none   Follow-Up: At [facility], you and your health needs are our priority.  As part of our continuing mission to provide you with exceptional heart care, we have created designated Provider Care Teams.  These Care Teams include your primary Cardiologist (physician) and Advanced Practice Providers (APPs -  Physician Assistants and Nurse Practitioners) who all work together to provide you with the care you need, when you need it.  We recommend signing up for the patient portal called "MyChart".  Sign up information is provided on this After Visit Summary.  MyChart is used to connect with patients for Virtual Visits (Telemedicine).  Patients are able to view lab/test results, encounter notes, upcoming appointments, etc.  Non-urgent messages can be sent to your provider as well.   To learn more about what you can do with MyChart, go to [email or web address].    Your next appointment:   1 year(s)  The format for your next appointment:   In Person  Provider:   Philip Nahser, MD   Other Instructions   

## 2020-09-22 DIAGNOSIS — Z20822 Contact with and (suspected) exposure to covid-19: Secondary | ICD-10-CM | POA: Diagnosis not present

## 2020-09-22 DIAGNOSIS — Z79899 Other long term (current) drug therapy: Secondary | ICD-10-CM | POA: Diagnosis not present

## 2020-09-22 DIAGNOSIS — E538 Deficiency of other specified B group vitamins: Secondary | ICD-10-CM | POA: Diagnosis not present

## 2020-09-22 DIAGNOSIS — E890 Postprocedural hypothyroidism: Secondary | ICD-10-CM | POA: Diagnosis not present

## 2020-09-22 DIAGNOSIS — F902 Attention-deficit hyperactivity disorder, combined type: Secondary | ICD-10-CM | POA: Diagnosis not present

## 2020-11-09 DIAGNOSIS — R1013 Epigastric pain: Secondary | ICD-10-CM | POA: Diagnosis not present

## 2020-12-21 DIAGNOSIS — S8991XA Unspecified injury of right lower leg, initial encounter: Secondary | ICD-10-CM | POA: Diagnosis not present

## 2020-12-21 DIAGNOSIS — M79604 Pain in right leg: Secondary | ICD-10-CM | POA: Diagnosis not present

## 2021-01-30 ENCOUNTER — Other Ambulatory Visit: Payer: Self-pay | Admitting: Internal Medicine

## 2021-02-15 DIAGNOSIS — R140 Abdominal distension (gaseous): Secondary | ICD-10-CM | POA: Diagnosis not present

## 2021-02-15 DIAGNOSIS — Z1211 Encounter for screening for malignant neoplasm of colon: Secondary | ICD-10-CM | POA: Diagnosis not present

## 2021-03-17 DIAGNOSIS — F902 Attention-deficit hyperactivity disorder, combined type: Secondary | ICD-10-CM | POA: Diagnosis not present

## 2021-03-17 DIAGNOSIS — Z79899 Other long term (current) drug therapy: Secondary | ICD-10-CM | POA: Diagnosis not present

## 2021-03-17 DIAGNOSIS — E890 Postprocedural hypothyroidism: Secondary | ICD-10-CM | POA: Diagnosis not present

## 2021-03-17 DIAGNOSIS — E538 Deficiency of other specified B group vitamins: Secondary | ICD-10-CM | POA: Diagnosis not present

## 2021-03-23 DIAGNOSIS — H524 Presbyopia: Secondary | ICD-10-CM | POA: Diagnosis not present

## 2021-05-26 ENCOUNTER — Encounter: Payer: Self-pay | Admitting: *Deleted

## 2021-05-29 ENCOUNTER — Ambulatory Visit: Admission: RE | Admit: 2021-05-29 | Payer: 59 | Source: Home / Self Care

## 2021-05-29 ENCOUNTER — Encounter: Payer: Self-pay | Admitting: Anesthesiology

## 2021-05-29 ENCOUNTER — Encounter: Admission: RE | Payer: Self-pay | Source: Home / Self Care

## 2021-05-29 SURGERY — COLONOSCOPY WITH PROPOFOL
Anesthesia: General

## 2021-05-29 NOTE — Anesthesia Preprocedure Evaluation (Deleted)
Anesthesia Evaluation  Patient identified by MRN, date of birth, ID band Patient awake    Reviewed: Allergy & Precautions, H&P , NPO status , Patient's Chart, lab work & pertinent test results, reviewed documented beta blocker date and time   Airway Mallampati: II   Neck ROM: full    Dental  (+) Teeth Intact   Pulmonary neg pulmonary ROS, former smoker,    Pulmonary exam normal        Cardiovascular negative cardio ROS Normal cardiovascular exam Rhythm:regular Rate:Normal     Neuro/Psych negative neurological ROS  negative psych ROS   GI/Hepatic negative GI ROS, Neg liver ROS,   Endo/Other  negative endocrine ROS  Renal/GU negative Renal ROS  negative genitourinary   Musculoskeletal   Abdominal   Peds  Hematology negative hematology ROS (+)   Anesthesia Other Findings Past Medical History: No date: Acute nonsuppurative otitis media, unspecified No date: Acute sinusitis, unspecified No date: AMA (advanced maternal age) multigravida 35+ No date: Attention deficit disorder without mention of hyperactivity No date: Attention deficit disorder without mention of hyperactivity No date: H/O varicella No date: Hypothyroid No date: No pertinent past medical history No date: Other and unspecified mycoses No date: Pharyngitis No date: Recurrent UTI (urinary tract infection) Past Surgical History: No date: CESAREAN SECTION No date: DILATION AND CURETTAGE OF UTERUS No date: KNEE ARTHROSCOPY 03/30/2014: THYROIDECTOMY   Reproductive/Obstetrics negative OB ROS                             Anesthesia Physical Anesthesia Plan  ASA: 3  Anesthesia Plan: General   Post-op Pain Management:    Induction:   PONV Risk Score and Plan:   Airway Management Planned:   Additional Equipment:   Intra-op Plan:   Post-operative Plan:   Informed Consent: I have reviewed the patients History and  Physical, chart, labs and discussed the procedure including the risks, benefits and alternatives for the proposed anesthesia with the patient or authorized representative who has indicated his/her understanding and acceptance.     Dental Advisory Given  Plan Discussed with: CRNA  Anesthesia Plan Comments:         Anesthesia Quick Evaluation

## 2021-09-20 DIAGNOSIS — E890 Postprocedural hypothyroidism: Secondary | ICD-10-CM | POA: Diagnosis not present

## 2021-09-20 DIAGNOSIS — E538 Deficiency of other specified B group vitamins: Secondary | ICD-10-CM | POA: Diagnosis not present

## 2021-09-20 DIAGNOSIS — Z79899 Other long term (current) drug therapy: Secondary | ICD-10-CM | POA: Diagnosis not present

## 2021-09-20 DIAGNOSIS — R002 Palpitations: Secondary | ICD-10-CM | POA: Diagnosis not present

## 2021-09-20 DIAGNOSIS — F902 Attention-deficit hyperactivity disorder, combined type: Secondary | ICD-10-CM | POA: Diagnosis not present

## 2021-11-23 IMAGING — US US ABDOMEN COMPLETE
1 series · 15 of 25 positions shown · non-contrast
Comparison: No recent.

CLINICAL DATA: Generalized abdominal pain.

EXAM:
ABDOMEN ULTRASOUND COMPLETE

[Series 1: us abdomen complete · 15 of 91 slices shown]
[im 1/91]
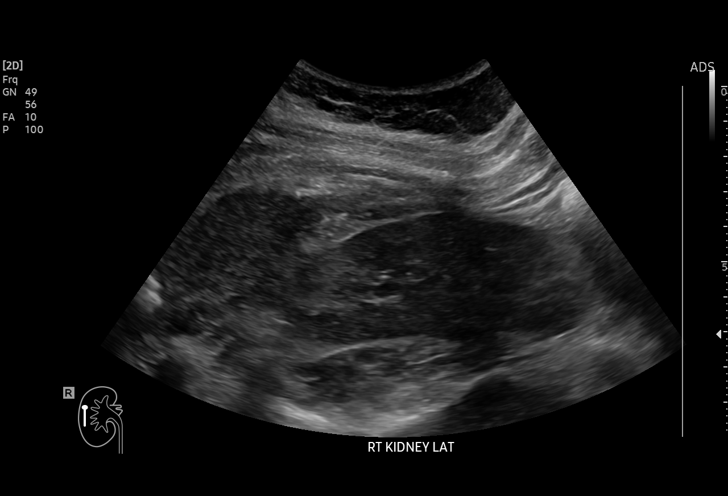
[im 8/91]
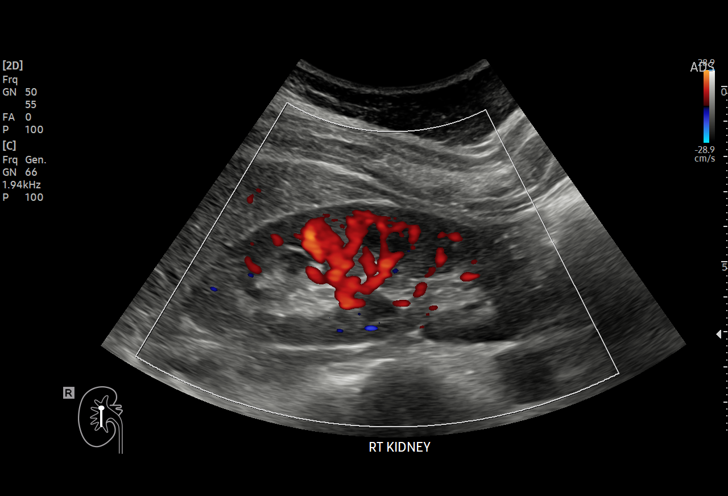
[im 16/91]
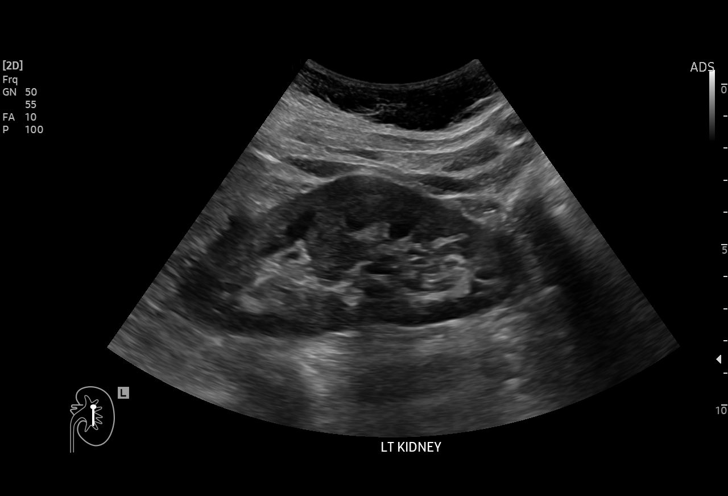
[im 19/91]
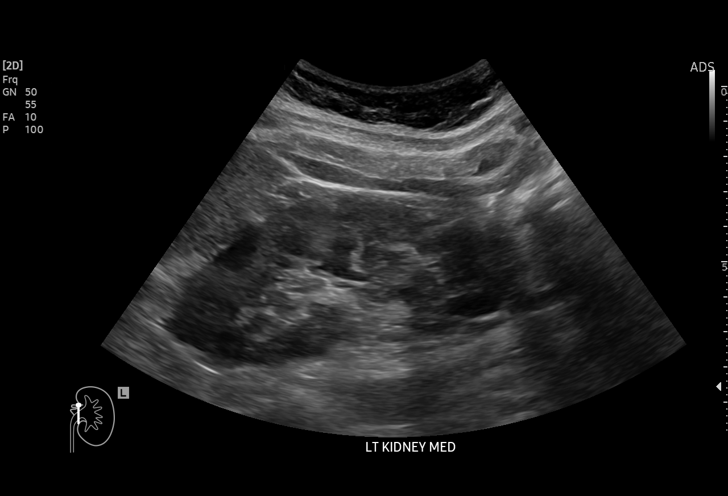
[im 27/91]
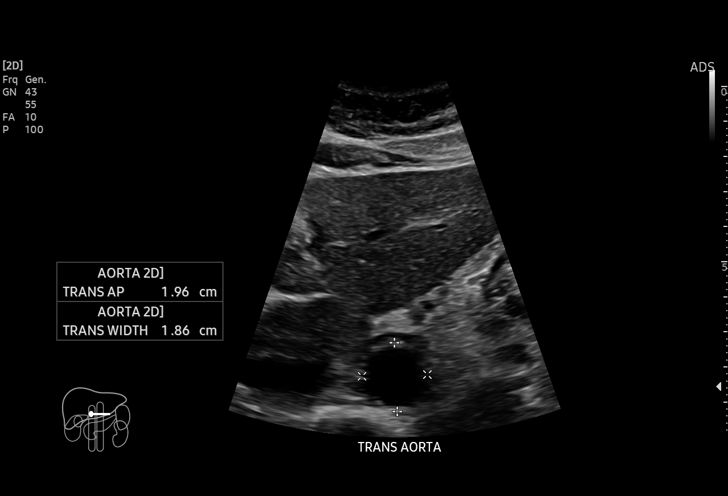
[im 34/91]
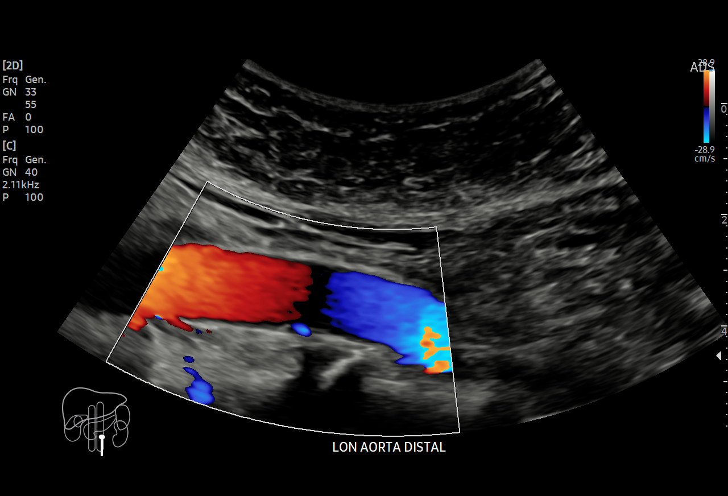
[im 38/91]
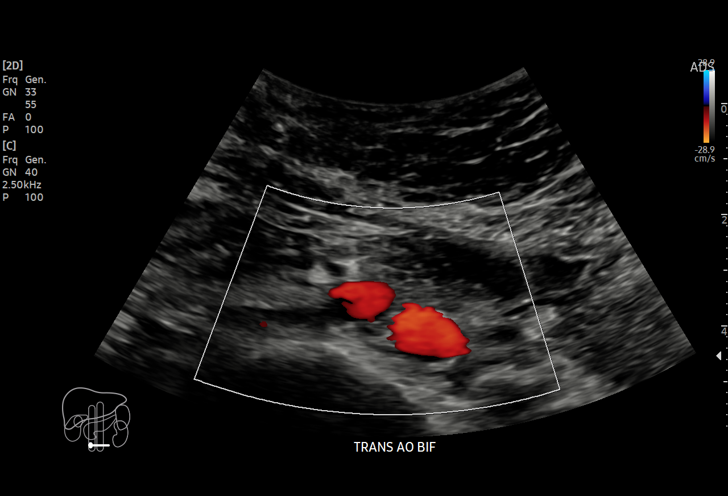
[im 46/91]
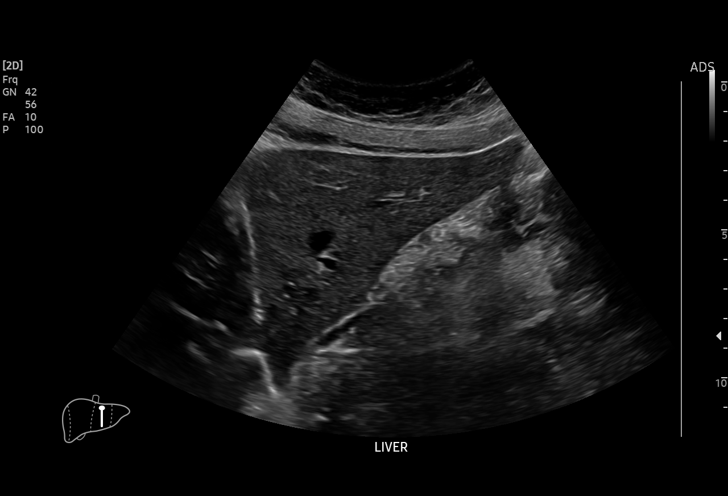
[im 53/91]
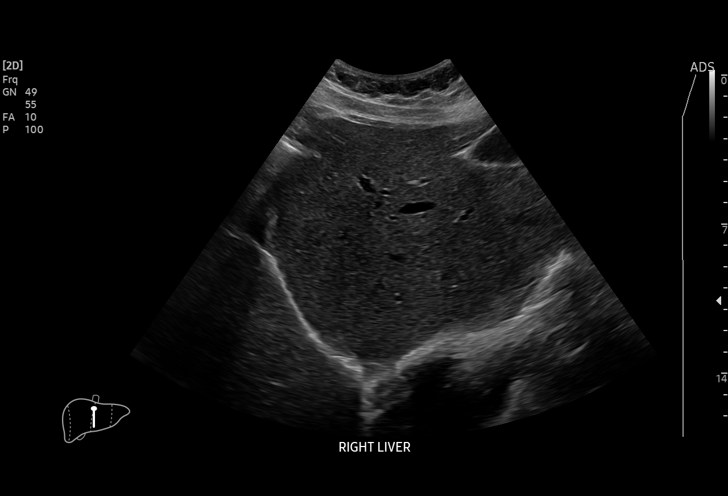
[im 57/91]
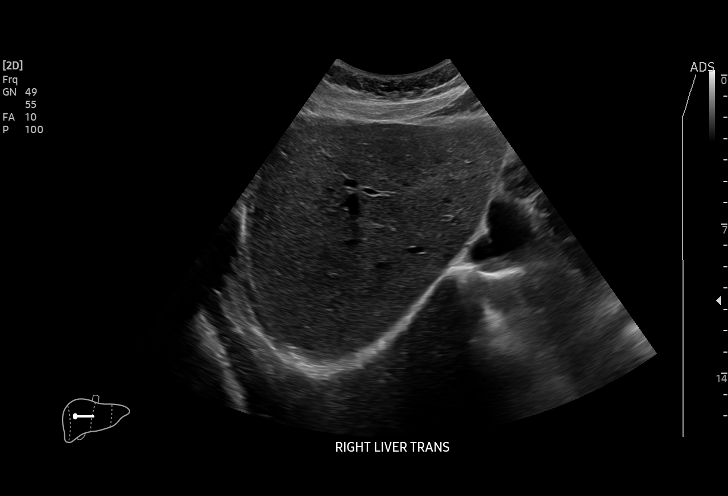
[im 64/91]
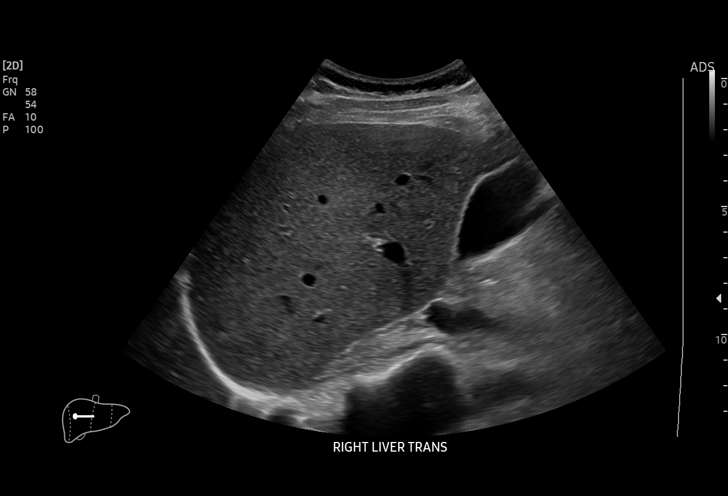
[im 72/91]
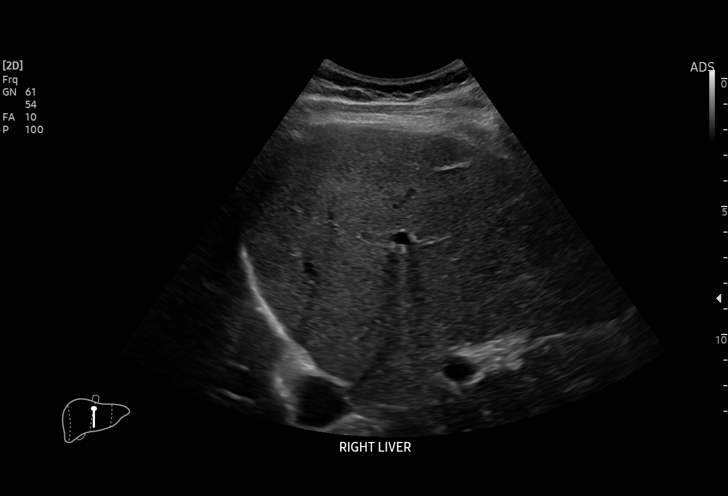
[im 76/91]
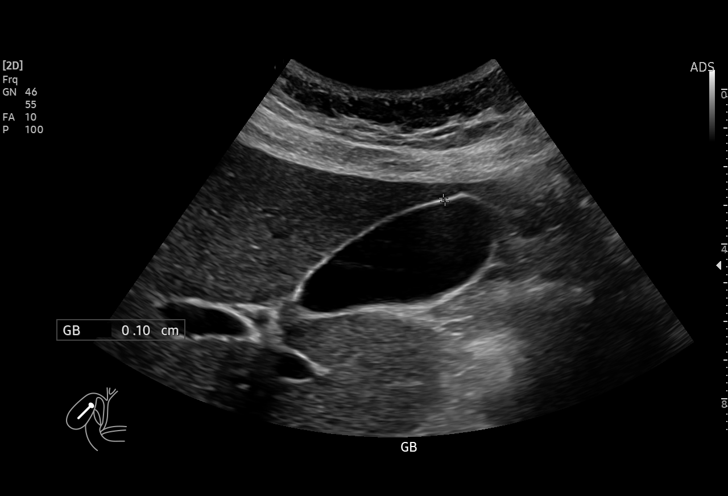
[im 83/91]
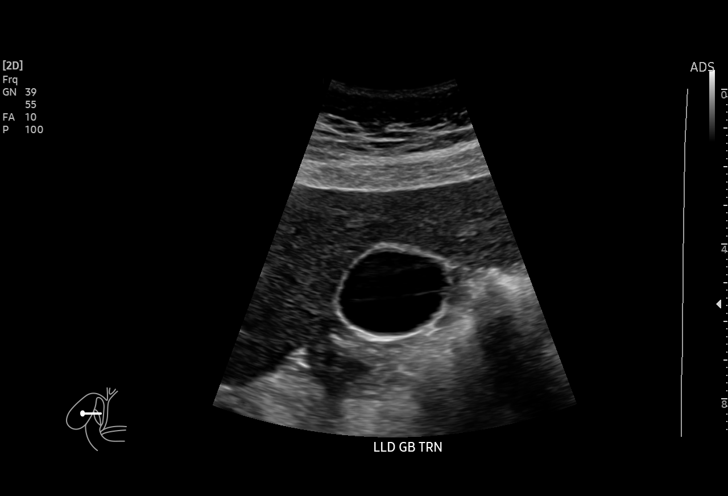
[im 91/91]
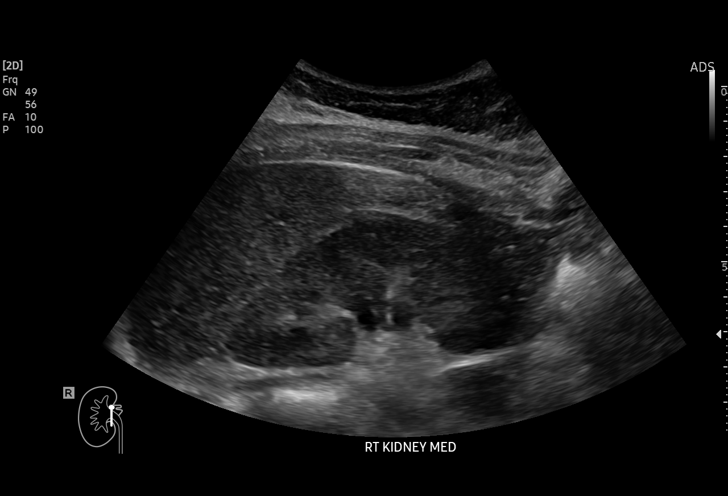

[15 of 25 positions shown; findings below may reference images not displayed]

FINDINGS: Gallbladder: No gallstones or wall thickening visualized. No
sonographic Murphy sign noted by sonographer.

Common bile duct: Diameter: 3.3 mm

Liver: No focal lesion identified. Within normal limits in
parenchymal echogenicity. Portal vein is patent on color Doppler
imaging with normal direction of blood flow towards the liver.

IVC: No abnormality visualized.

Pancreas: Visualized portion unremarkable.

Spleen: Size and appearance within normal limits.

Right Kidney: Length: 10.1 cm. Echogenicity within normal limits. No
mass or hydronephrosis visualized.

Left Kidney: Length: 10.7 cm. Echogenicity within normal limits. No
mass or hydronephrosis visualized.

Abdominal aorta: No aneurysm visualized.

Other findings: None.
IMPRESSION: Negative exam.

## 2022-03-06 ENCOUNTER — Other Ambulatory Visit: Payer: Self-pay | Admitting: Internal Medicine

## 2022-03-06 DIAGNOSIS — F900 Attention-deficit hyperactivity disorder, predominantly inattentive type: Secondary | ICD-10-CM | POA: Diagnosis not present

## 2022-03-06 DIAGNOSIS — E538 Deficiency of other specified B group vitamins: Secondary | ICD-10-CM | POA: Diagnosis not present

## 2022-03-06 DIAGNOSIS — Z1231 Encounter for screening mammogram for malignant neoplasm of breast: Secondary | ICD-10-CM

## 2022-03-06 DIAGNOSIS — E890 Postprocedural hypothyroidism: Secondary | ICD-10-CM | POA: Diagnosis not present

## 2022-03-06 DIAGNOSIS — R002 Palpitations: Secondary | ICD-10-CM | POA: Diagnosis not present

## 2022-03-06 DIAGNOSIS — Z79899 Other long term (current) drug therapy: Secondary | ICD-10-CM | POA: Diagnosis not present

## 2022-05-02 DIAGNOSIS — H524 Presbyopia: Secondary | ICD-10-CM | POA: Diagnosis not present

## 2022-05-20 DIAGNOSIS — R609 Edema, unspecified: Secondary | ICD-10-CM | POA: Diagnosis not present

## 2022-05-23 DIAGNOSIS — Z79899 Other long term (current) drug therapy: Secondary | ICD-10-CM | POA: Diagnosis not present

## 2022-05-23 DIAGNOSIS — M25561 Pain in right knee: Secondary | ICD-10-CM | POA: Diagnosis not present

## 2022-05-23 DIAGNOSIS — H04123 Dry eye syndrome of bilateral lacrimal glands: Secondary | ICD-10-CM | POA: Diagnosis not present

## 2022-05-23 DIAGNOSIS — E890 Postprocedural hypothyroidism: Secondary | ICD-10-CM | POA: Diagnosis not present

## 2022-05-23 DIAGNOSIS — M25562 Pain in left knee: Secondary | ICD-10-CM | POA: Diagnosis not present

## 2022-09-12 ENCOUNTER — Other Ambulatory Visit: Payer: Self-pay | Admitting: Internal Medicine

## 2022-09-12 DIAGNOSIS — F909 Attention-deficit hyperactivity disorder, unspecified type: Secondary | ICD-10-CM | POA: Diagnosis not present

## 2022-09-12 DIAGNOSIS — E538 Deficiency of other specified B group vitamins: Secondary | ICD-10-CM | POA: Diagnosis not present

## 2022-09-12 DIAGNOSIS — E890 Postprocedural hypothyroidism: Secondary | ICD-10-CM | POA: Diagnosis not present

## 2022-09-12 DIAGNOSIS — Z79899 Other long term (current) drug therapy: Secondary | ICD-10-CM | POA: Diagnosis not present

## 2022-09-12 DIAGNOSIS — I7300 Raynaud's syndrome without gangrene: Secondary | ICD-10-CM | POA: Diagnosis not present

## 2022-09-12 DIAGNOSIS — Z1231 Encounter for screening mammogram for malignant neoplasm of breast: Secondary | ICD-10-CM | POA: Diagnosis not present

## 2022-09-12 DIAGNOSIS — R0789 Other chest pain: Secondary | ICD-10-CM | POA: Diagnosis not present

## 2022-09-12 DIAGNOSIS — R002 Palpitations: Secondary | ICD-10-CM | POA: Diagnosis not present

## 2022-10-03 DIAGNOSIS — R002 Palpitations: Secondary | ICD-10-CM | POA: Diagnosis not present

## 2022-10-03 DIAGNOSIS — R0789 Other chest pain: Secondary | ICD-10-CM | POA: Diagnosis not present

## 2022-11-28 ENCOUNTER — Ambulatory Visit
Admission: RE | Admit: 2022-11-28 | Discharge: 2022-11-28 | Disposition: A | Discharge status: Home / Self Care | Payer: 59 | Source: Ambulatory Visit | Attending: Internal Medicine | Admitting: Internal Medicine

## 2022-11-28 DIAGNOSIS — Z1231 Encounter for screening mammogram for malignant neoplasm of breast: Secondary | ICD-10-CM | POA: Insufficient documentation

## 2023-04-04 DIAGNOSIS — M79641 Pain in right hand: Secondary | ICD-10-CM | POA: Diagnosis not present

## 2023-04-11 DIAGNOSIS — R002 Palpitations: Secondary | ICD-10-CM | POA: Diagnosis not present

## 2023-04-11 DIAGNOSIS — Z0000 Encounter for general adult medical examination without abnormal findings: Secondary | ICD-10-CM | POA: Diagnosis not present

## 2023-04-11 DIAGNOSIS — Z79899 Other long term (current) drug therapy: Secondary | ICD-10-CM | POA: Diagnosis not present

## 2023-04-11 DIAGNOSIS — Z01419 Encounter for gynecological examination (general) (routine) without abnormal findings: Secondary | ICD-10-CM | POA: Diagnosis not present

## 2023-04-11 DIAGNOSIS — E890 Postprocedural hypothyroidism: Secondary | ICD-10-CM | POA: Diagnosis not present

## 2023-04-11 DIAGNOSIS — E538 Deficiency of other specified B group vitamins: Secondary | ICD-10-CM | POA: Diagnosis not present

## 2023-04-11 DIAGNOSIS — R8290 Unspecified abnormal findings in urine: Secondary | ICD-10-CM | POA: Diagnosis not present

## 2023-04-11 DIAGNOSIS — F909 Attention-deficit hyperactivity disorder, unspecified type: Secondary | ICD-10-CM | POA: Diagnosis not present

## 2023-04-11 DIAGNOSIS — I7300 Raynaud's syndrome without gangrene: Secondary | ICD-10-CM | POA: Diagnosis not present

## 2023-04-11 DIAGNOSIS — Z13220 Encounter for screening for lipoid disorders: Secondary | ICD-10-CM | POA: Diagnosis not present

## 2023-05-20 DIAGNOSIS — H524 Presbyopia: Secondary | ICD-10-CM | POA: Diagnosis not present

## 2023-07-05 ENCOUNTER — Other Ambulatory Visit
Admission: RE | Admit: 2023-07-05 | Discharge: 2023-07-05 | Disposition: A | Discharge status: Home / Self Care | Payer: 59 | Source: Ambulatory Visit | Attending: Internal Medicine | Admitting: Internal Medicine

## 2023-07-05 DIAGNOSIS — M79661 Pain in right lower leg: Secondary | ICD-10-CM | POA: Diagnosis not present

## 2023-07-05 DIAGNOSIS — M7989 Other specified soft tissue disorders: Secondary | ICD-10-CM | POA: Insufficient documentation

## 2023-07-05 DIAGNOSIS — M25561 Pain in right knee: Secondary | ICD-10-CM | POA: Diagnosis not present

## 2023-07-05 LAB — D-DIMER, QUANTITATIVE: D-Dimer, Quant: 0.48 ug{FEU}/mL (ref 0.00–0.50)

## 2023-07-20 DIAGNOSIS — J040 Acute laryngitis: Secondary | ICD-10-CM | POA: Diagnosis not present

## 2023-07-20 DIAGNOSIS — Z03818 Encounter for observation for suspected exposure to other biological agents ruled out: Secondary | ICD-10-CM | POA: Diagnosis not present

## 2023-07-20 DIAGNOSIS — B9689 Other specified bacterial agents as the cause of diseases classified elsewhere: Secondary | ICD-10-CM | POA: Diagnosis not present

## 2023-07-20 DIAGNOSIS — J019 Acute sinusitis, unspecified: Secondary | ICD-10-CM | POA: Diagnosis not present

## 2023-11-14 ENCOUNTER — Other Ambulatory Visit: Payer: Self-pay | Admitting: Internal Medicine

## 2023-11-14 DIAGNOSIS — Z1231 Encounter for screening mammogram for malignant neoplasm of breast: Secondary | ICD-10-CM
